# Patient Record
Sex: Female | Born: 1975 | Hispanic: Yes | Smoking: Former smoker
Health system: Southern US, Community
[De-identification: ages and names within clinical notes are randomized; demographics above are authoritative.]

## PROBLEM LIST (undated history)

## (undated) DIAGNOSIS — G35 Multiple sclerosis: Secondary | ICD-10-CM

## (undated) DIAGNOSIS — F329 Major depressive disorder, single episode, unspecified: Secondary | ICD-10-CM

## (undated) DIAGNOSIS — F419 Anxiety disorder, unspecified: Secondary | ICD-10-CM

## (undated) HISTORY — DX: Major depressive disorder, single episode, unspecified: F32.9

## (undated) HISTORY — DX: Anxiety disorder, unspecified: F41.9

## (undated) HISTORY — PX: ANGIOPLASTY: SHX39

---

## 2017-02-20 ENCOUNTER — Inpatient Hospital Stay (HOSPITAL_COMMUNITY)
Admission: EM | Admit: 2017-02-20 | Discharge: 2017-03-03 | DRG: 880 | Disposition: A | Discharge status: Home / Self Care | Payer: Medicare Other | Attending: Family Medicine | Admitting: Family Medicine

## 2017-02-20 ENCOUNTER — Emergency Department (HOSPITAL_COMMUNITY): Payer: Medicare Other

## 2017-02-20 ENCOUNTER — Encounter (HOSPITAL_COMMUNITY): Payer: Self-pay | Admitting: Oncology

## 2017-02-20 DIAGNOSIS — G319 Degenerative disease of nervous system, unspecified: Secondary | ICD-10-CM | POA: Diagnosis present

## 2017-02-20 DIAGNOSIS — F439 Reaction to severe stress, unspecified: Secondary | ICD-10-CM

## 2017-02-20 DIAGNOSIS — E860 Dehydration: Secondary | ICD-10-CM | POA: Diagnosis present

## 2017-02-20 DIAGNOSIS — Z87891 Personal history of nicotine dependence: Secondary | ICD-10-CM

## 2017-02-20 DIAGNOSIS — X58XXXA Exposure to other specified factors, initial encounter: Secondary | ICD-10-CM | POA: Diagnosis present

## 2017-02-20 DIAGNOSIS — R4182 Altered mental status, unspecified: Secondary | ICD-10-CM | POA: Diagnosis not present

## 2017-02-20 DIAGNOSIS — F430 Acute stress reaction: Secondary | ICD-10-CM | POA: Diagnosis present

## 2017-02-20 DIAGNOSIS — Q232 Congenital mitral stenosis: Secondary | ICD-10-CM

## 2017-02-20 DIAGNOSIS — R9389 Abnormal findings on diagnostic imaging of other specified body structures: Secondary | ICD-10-CM

## 2017-02-20 DIAGNOSIS — T7621XA Adult sexual abuse, suspected, initial encounter: Secondary | ICD-10-CM | POA: Diagnosis present

## 2017-02-20 DIAGNOSIS — D72829 Elevated white blood cell count, unspecified: Secondary | ICD-10-CM | POA: Diagnosis present

## 2017-02-20 DIAGNOSIS — F441 Dissociative fugue: Principal | ICD-10-CM | POA: Diagnosis present

## 2017-02-20 DIAGNOSIS — Z880 Allergy status to penicillin: Secondary | ICD-10-CM

## 2017-02-20 DIAGNOSIS — G9340 Encephalopathy, unspecified: Secondary | ICD-10-CM | POA: Diagnosis present

## 2017-02-20 DIAGNOSIS — G35 Multiple sclerosis: Secondary | ICD-10-CM | POA: Diagnosis present

## 2017-02-20 LAB — I-STAT CHEM 8, ED
BUN: 11 mg/dL (ref 6–20)
CHLORIDE: 107 mmol/L (ref 101–111)
CREATININE: 0.6 mg/dL (ref 0.44–1.00)
Calcium, Ion: 1.14 mmol/L — ABNORMAL LOW (ref 1.15–1.40)
GLUCOSE: 76 mg/dL (ref 65–99)
HEMATOCRIT: 38 % (ref 36.0–46.0)
Hemoglobin: 12.9 g/dL (ref 12.0–15.0)
POTASSIUM: 3.5 mmol/L (ref 3.5–5.1)
Sodium: 140 mmol/L (ref 135–145)
TCO2: 25 mmol/L (ref 0–100)

## 2017-02-20 LAB — I-STAT BETA HCG BLOOD, ED (MC, WL, AP ONLY): I-stat hCG, quantitative: <5 m[IU]/mL (ref <5)

## 2017-02-20 LAB — CBG MONITORING, ED: Glucose-Capillary: 86 mg/dL (ref 65–99)

## 2017-02-20 MED ORDER — IOPAMIDOL (ISOVUE-300) INJECTION 61%
INTRAVENOUS | Status: AC
  Administered 2017-02-21: 100 mL via INTRAVENOUS
  Filled 2017-02-20: qty 100
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
## 2017-02-20 NOTE — ED Notes (Signed)
Pt waiting on provider to sign up.
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
## 2017-02-20 NOTE — ED Notes (Signed)
Bed: WA14 Expected date:  Expected time:  Means of arrival:  Comments: AMS 

## 2017-02-21 ENCOUNTER — Observation Stay (HOSPITAL_COMMUNITY): Payer: Medicare Other

## 2017-02-21 ENCOUNTER — Encounter (HOSPITAL_COMMUNITY): Payer: Self-pay | Admitting: Emergency Medicine

## 2017-02-21 ENCOUNTER — Observation Stay (HOSPITAL_BASED_OUTPATIENT_CLINIC_OR_DEPARTMENT_OTHER)
Admit: 2017-02-21 | Discharge: 2017-02-21 | Disposition: A | Discharge status: Home / Self Care | Payer: Medicare Other | Attending: Internal Medicine | Admitting: Internal Medicine

## 2017-02-21 DIAGNOSIS — D72829 Elevated white blood cell count, unspecified: Secondary | ICD-10-CM

## 2017-02-21 DIAGNOSIS — R4182 Altered mental status, unspecified: Secondary | ICD-10-CM

## 2017-02-21 DIAGNOSIS — R17 Unspecified jaundice: Secondary | ICD-10-CM

## 2017-02-21 DIAGNOSIS — R938 Abnormal findings on diagnostic imaging of other specified body structures: Secondary | ICD-10-CM

## 2017-02-21 DIAGNOSIS — G9340 Encephalopathy, unspecified: Secondary | ICD-10-CM | POA: Diagnosis present

## 2017-02-21 LAB — URINALYSIS, COMPLETE (UACMP) WITH MICROSCOPIC
BILIRUBIN URINE: NEGATIVE
Glucose, UA: NEGATIVE mg/dL
KETONES UR: 80 mg/dL — AB
LEUKOCYTES UA: NEGATIVE
NITRITE: NEGATIVE
PH: 6 (ref 5.0–8.0)
Protein, ur: NEGATIVE mg/dL

## 2017-02-21 LAB — RAPID URINE DRUG SCREEN, HOSP PERFORMED
Amphetamines: NOT DETECTED
BARBITURATES: NOT DETECTED
BENZODIAZEPINES: NOT DETECTED
Cocaine: NOT DETECTED
Opiates: NOT DETECTED
Tetrahydrocannabinol: POSITIVE — AB

## 2017-02-21 LAB — CBC WITH DIFFERENTIAL/PLATELET
BASOS ABS: 0 10*3/uL (ref 0.0–0.1)
Basophils Relative: 0 %
EOS PCT: 1 %
Eosinophils Absolute: 0.1 10*3/uL (ref 0.0–0.7)
HCT: 35.7 % — ABNORMAL LOW (ref 36.0–46.0)
Hemoglobin: 12.3 g/dL (ref 12.0–15.0)
LYMPHS PCT: 15 %
Lymphs Abs: 1.8 10*3/uL (ref 0.7–4.0)
MCH: 31.3 pg (ref 26.0–34.0)
MCHC: 34.5 g/dL (ref 30.0–36.0)
MCV: 90.8 fL (ref 78.0–100.0)
Monocytes Absolute: 0.7 10*3/uL (ref 0.1–1.0)
Monocytes Relative: 6 %
NEUTROS ABS: 9.7 10*3/uL — AB (ref 1.7–7.7)
NEUTROS PCT: 78 %
PLATELETS: 379 10*3/uL (ref 150–400)
RBC: 3.93 MIL/uL (ref 3.87–5.11)
RDW: 12.8 % (ref 11.5–15.5)
WBC: 12.3 10*3/uL — AB (ref 4.0–10.5)

## 2017-02-21 LAB — CREATININE, SERUM
Creatinine, Ser: 0.62 mg/dL (ref 0.44–1.00)
GFR calc non Af Amer: 53 mL/min — ABNORMAL LOW (ref >60)

## 2017-02-21 LAB — CBC
HEMATOCRIT: 35 % — AB (ref 36.0–46.0)
Hemoglobin: 12.2 g/dL (ref 12.0–15.0)
MCH: 32.3 pg (ref 26.0–34.0)
MCHC: 34.9 g/dL (ref 30.0–36.0)
MCV: 92.6 fL (ref 78.0–100.0)
PLATELETS: 368 10*3/uL (ref 150–400)
RBC: 3.78 MIL/uL — ABNORMAL LOW (ref 3.87–5.11)
RDW: 13 % (ref 11.5–15.5)
WBC: 10.3 10*3/uL (ref 4.0–10.5)

## 2017-02-21 LAB — T4, FREE: Free T4: 1.24 ng/dL — ABNORMAL HIGH (ref 0.61–1.12)

## 2017-02-21 LAB — VITAMIN B12: Vitamin B-12: 255 pg/mL (ref 180–914)

## 2017-02-21 LAB — HEPATIC FUNCTION PANEL
ALT: 21 U/L (ref 14–54)
AST: 40 U/L (ref 15–41)
Albumin: 3.9 g/dL (ref 3.5–5.0)
Alkaline Phosphatase: 47 U/L (ref 38–126)
BILIRUBIN DIRECT: 0.3 mg/dL (ref 0.1–0.5)
Indirect Bilirubin: 1 mg/dL — ABNORMAL HIGH (ref 0.3–0.9)
Total Bilirubin: 1.3 mg/dL — ABNORMAL HIGH (ref 0.3–1.2)
Total Protein: 6.7 g/dL (ref 6.5–8.1)

## 2017-02-21 LAB — ACETAMINOPHEN LEVEL

## 2017-02-21 LAB — ETHANOL: Alcohol, Ethyl (B): <5 mg/dL (ref <5)

## 2017-02-21 LAB — SALICYLATE LEVEL

## 2017-02-21 LAB — AMMONIA: AMMONIA: 32 umol/L (ref 9–35)

## 2017-02-21 LAB — TSH: TSH: 0.382 u[IU]/mL (ref 0.350–4.500)

## 2017-02-21 MED ORDER — ENOXAPARIN SODIUM 40 MG/0.4ML ~~LOC~~ SOLN
40.0000 mg | SUBCUTANEOUS | Status: DC
  Administered 2017-02-21 – 2017-03-03 (×11): 40 mg via SUBCUTANEOUS
  Filled 2017-02-21 (×11): qty 0.4

## 2017-02-21 MED ORDER — ACETAMINOPHEN 650 MG RE SUPP
650.0000 mg | Freq: Four times a day (QID) | RECTAL | Status: DC | PRN
Start: 2017-02-21 — End: 2017-03-03

## 2017-02-21 MED ORDER — SODIUM CHLORIDE 0.9 % IV SOLN
INTRAVENOUS | Status: DC
  Administered 2017-02-21 – 2017-02-23 (×3): via INTRAVENOUS

## 2017-02-21 MED ORDER — ONDANSETRON HCL 4 MG/2ML IJ SOLN: 4.0000 mg | Freq: Four times a day (QID) | INTRAMUSCULAR | Status: DC | PRN

## 2017-02-21 MED ORDER — ACETAMINOPHEN 325 MG PO TABS: 650.0000 mg | ORAL_TABLET | Freq: Four times a day (QID) | ORAL | Status: DC | PRN

## 2017-02-21 MED ORDER — NALOXONE HCL 2 MG/2ML IJ SOSY
4.0000 mg | PREFILLED_SYRINGE | Freq: Once | INTRAMUSCULAR | Status: AC
  Administered 2017-02-21: 4 mg via INTRAVENOUS
  Filled 2017-02-21: qty 4

## 2017-02-21 MED ORDER — ONDANSETRON HCL 4 MG PO TABS: 4.0000 mg | ORAL_TABLET | Freq: Four times a day (QID) | ORAL | Status: DC | PRN

## 2017-02-21 MED ORDER — SODIUM CHLORIDE 0.9 % IV BOLUS (SEPSIS)
1000.0000 mL | Freq: Once | INTRAVENOUS | Status: AC
  Administered 2017-02-21: 1000 mL via INTRAVENOUS

## 2017-02-21 MED ORDER — GADOBENATE DIMEGLUMINE 529 MG/ML IV SOLN
15.0000 mL | Freq: Once | INTRAVENOUS | Status: AC
  Administered 2017-02-21: 15 mL via INTRAVENOUS

## 2017-02-21 MED ORDER — SENNOSIDES-DOCUSATE SODIUM 8.6-50 MG PO TABS: 1.0000 | ORAL_TABLET | Freq: Every evening | ORAL | Status: DC | PRN

## 2017-02-21 NOTE — Progress Notes (Signed)
CMT called saying patient had all leads off. Upon going to room to reapply tele, the patient was not in the room or BR. Security called and a visitor stated "I saw a patient go down the stairs." Writer ran outside and caught up with the patient in the visitor parking lot. Patient stated, "I wasn't going anywhere I was just going on a walk." Patient escorted back to her room, explained she cannot leave the floor and to call if she wants to walk. Bed alarm set, MD paged. Patient will be IVC'd and sitter at bedside.

## 2017-02-21 NOTE — Progress Notes (Signed)
The patient stated that she remembered her name was "Emily [name]". The patient's belongings are in plastic bags or brown paper bags on the bench-in case [facility] needs them.  Patient cried softly for a short time.
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
## 2017-02-21 NOTE — ED Notes (Addendum)
Poetry from [facility] notified that patient would be admitted and advised there was no change in mentation from earlier. Emily [name] advised that she would pass on information to oncoming day-shift [facility] nurse.

## 2017-02-21 NOTE — Progress Notes (Signed)
EEG Completed; Results Pending  
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
## 2017-02-21 NOTE — Progress Notes (Signed)
CSW is assisting with IVC. Magistrate reports there is a different process when a patient has no identification, waiting for return call from [employer] Physician informed.   Vivi Barrack, Theresia Majors, MSW Clinical Social Worker 5E and Psychiatric Service Line [phone] 02/21/2017  9:49 AM

## 2017-02-22 DIAGNOSIS — D72829 Elevated white blood cell count, unspecified: Secondary | ICD-10-CM | POA: Diagnosis present

## 2017-02-22 DIAGNOSIS — Z880 Allergy status to penicillin: Secondary | ICD-10-CM | POA: Diagnosis not present

## 2017-02-22 DIAGNOSIS — E860 Dehydration: Secondary | ICD-10-CM | POA: Diagnosis present

## 2017-02-22 DIAGNOSIS — F430 Acute stress reaction: Secondary | ICD-10-CM | POA: Diagnosis present

## 2017-02-22 DIAGNOSIS — G35 Multiple sclerosis: Secondary | ICD-10-CM | POA: Diagnosis present

## 2017-02-22 DIAGNOSIS — T7621XA Adult sexual abuse, suspected, initial encounter: Secondary | ICD-10-CM | POA: Diagnosis present

## 2017-02-22 DIAGNOSIS — Z87891 Personal history of nicotine dependence: Secondary | ICD-10-CM

## 2017-02-22 DIAGNOSIS — F441 Dissociative fugue: Secondary | ICD-10-CM | POA: Diagnosis present

## 2017-02-22 DIAGNOSIS — R4182 Altered mental status, unspecified: Secondary | ICD-10-CM | POA: Diagnosis present

## 2017-02-22 DIAGNOSIS — Q232 Congenital mitral stenosis: Secondary | ICD-10-CM | POA: Diagnosis not present

## 2017-02-22 DIAGNOSIS — F439 Reaction to severe stress, unspecified: Secondary | ICD-10-CM

## 2017-02-22 DIAGNOSIS — Z79899 Other long term (current) drug therapy: Secondary | ICD-10-CM

## 2017-02-22 DIAGNOSIS — G9340 Encephalopathy, unspecified: Secondary | ICD-10-CM | POA: Diagnosis present

## 2017-02-22 DIAGNOSIS — F1290 Cannabis use, unspecified, uncomplicated: Secondary | ICD-10-CM

## 2017-02-22 DIAGNOSIS — X58XXXA Exposure to other specified factors, initial encounter: Secondary | ICD-10-CM | POA: Diagnosis present

## 2017-02-22 DIAGNOSIS — G319 Degenerative disease of nervous system, unspecified: Secondary | ICD-10-CM | POA: Diagnosis present

## 2017-02-22 LAB — CBC
HEMATOCRIT: 34.7 % — AB (ref 36.0–46.0)
HEMOGLOBIN: 11.9 g/dL — AB (ref 12.0–15.0)
MCH: 31.6 pg (ref 26.0–34.0)
MCHC: 34.3 g/dL (ref 30.0–36.0)
MCV: 92.3 fL (ref 78.0–100.0)
Platelets: 358 10*3/uL (ref 150–400)
RBC: 3.76 MIL/uL — AB (ref 3.87–5.11)
RDW: 13.2 % (ref 11.5–15.5)
WBC: 7.2 10*3/uL (ref 4.0–10.5)

## 2017-02-22 LAB — COMPREHENSIVE METABOLIC PANEL
ALBUMIN: 3.5 g/dL (ref 3.5–5.0)
ALT: 20 U/L (ref 14–54)
AST: 35 U/L (ref 15–41)
Alkaline Phosphatase: 41 U/L (ref 38–126)
Anion gap: 5 (ref 5–15)
BUN: 5 mg/dL — ABNORMAL LOW (ref 6–20)
CHLORIDE: 109 mmol/L (ref 101–111)
CO2: 26 mmol/L (ref 22–32)
Calcium: 8.6 mg/dL — ABNORMAL LOW (ref 8.9–10.3)
Creatinine, Ser: 0.66 mg/dL (ref 0.44–1.00)
GFR calc non Af Amer: 52 mL/min — ABNORMAL LOW (ref >60)
Glucose, Bld: 112 mg/dL — ABNORMAL HIGH (ref 65–99)
POTASSIUM: 3.5 mmol/L (ref 3.5–5.1)
Sodium: 140 mmol/L (ref 135–145)
Total Bilirubin: 0.5 mg/dL (ref 0.3–1.2)
Total Protein: 5.9 g/dL — ABNORMAL LOW (ref 6.5–8.1)
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
## 2017-02-22 NOTE — Progress Notes (Signed)
Upon assessing patients orientation. Writer asked patient, "do you remember your name or DOB yet?" patient replied, "Yes my name is Emily [name], and my DOB is 11/15/1974"
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
## 2017-02-22 NOTE — Progress Notes (Signed)
CSW stepped in to speak with patient. Patient stated name was "Emily [name]".   Stacy Gardner, LCSWA Clinical Social Worker [phone]

## 2017-02-23 DIAGNOSIS — R4182 Altered mental status, unspecified: Secondary | ICD-10-CM

## 2017-02-23 LAB — SEDIMENTATION RATE: Sed Rate: 10 mm/hr (ref 0–22)

## 2017-02-23 LAB — BASIC METABOLIC PANEL
ANION GAP: 5 (ref 5–15)
CHLORIDE: 111 mmol/L (ref 101–111)
CO2: 24 mmol/L (ref 22–32)
Calcium: 8.6 mg/dL — ABNORMAL LOW (ref 8.9–10.3)
Creatinine, Ser: 0.51 mg/dL (ref 0.44–1.00)
GFR calc Af Amer: >60 mL/min (ref >60)
GFR calc non Af Amer: 56 mL/min — ABNORMAL LOW (ref >60)
GLUCOSE: 93 mg/dL (ref 65–99)
POTASSIUM: 3.6 mmol/L (ref 3.5–5.1)
Sodium: 140 mmol/L (ref 135–145)

## 2017-02-23 LAB — HEPATIC FUNCTION PANEL
ALBUMIN: 3.6 g/dL (ref 3.5–5.0)
ALK PHOS: 39 U/L (ref 38–126)
ALT: 19 U/L (ref 14–54)
AST: 28 U/L (ref 15–41)
BILIRUBIN TOTAL: 0.7 mg/dL (ref 0.3–1.2)
Bilirubin, Direct: <0.1 mg/dL — ABNORMAL LOW (ref 0.1–0.5)
Total Protein: 6 g/dL — ABNORMAL LOW (ref 6.5–8.1)

## 2017-02-23 LAB — CBC WITH DIFFERENTIAL/PLATELET
BASOS PCT: 1 %
Basophils Absolute: 0.1 10*3/uL (ref 0.0–0.1)
Eosinophils Absolute: 1.4 10*3/uL — ABNORMAL HIGH (ref 0.0–0.7)
Eosinophils Relative: 20 %
HEMATOCRIT: 34.6 % — AB (ref 36.0–46.0)
HEMOGLOBIN: 11.9 g/dL — AB (ref 12.0–15.0)
LYMPHS ABS: 1.6 10*3/uL (ref 0.7–4.0)
Lymphocytes Relative: 21 %
MCH: 31.5 pg (ref 26.0–34.0)
MCHC: 34.4 g/dL (ref 30.0–36.0)
MCV: 91.5 fL (ref 78.0–100.0)
MONO ABS: 0.6 10*3/uL (ref 0.1–1.0)
MONOS PCT: 9 %
NEUTROS ABS: 3.6 10*3/uL (ref 1.7–7.7)
NEUTROS PCT: 49 %
Platelets: 330 10*3/uL (ref 150–400)
RBC: 3.78 MIL/uL — ABNORMAL LOW (ref 3.87–5.11)
RDW: 13.1 % (ref 11.5–15.5)
WBC: 7.3 10*3/uL (ref 4.0–10.5)

## 2017-02-23 LAB — MAGNESIUM: Magnesium: 1.8 mg/dL (ref 1.7–2.4)

## 2017-02-23 LAB — C-REACTIVE PROTEIN: CRP: <0.8 mg/dL (ref <1.0)

## 2017-02-23 NOTE — Progress Notes (Signed)
Assuming care of patient from previous RN.  Agree with previous RN's assessment of patient. Patient stable at this time. Will continue to monitor patient.  

## 2017-02-23 NOTE — Care Management Note (Signed)
Case Management Note  Patient Details  Name: Emily [name] MRN: [id number] Date of Birth: 11/15/1874  Subjective/Objective:  CSW-managing d/c to [facility].                  Action/Plan:d/c [facility]   Expected Discharge Date:  02/23/17               Expected Discharge Plan:  Psychiatric Hospital  In-House Referral:  Clinical Social Work  Discharge planning Services  CM Consult  Post Acute Care Choice:    Choice offered to:     DME Arranged:    DME Agency:     HH Arranged:    HH Agency:     Status of Service:  Completed, signed off  If discussed at [employer], dates discussed:    Additional Comments:  Lanier Clam, RN 02/23/2017, 11:49 AM

## 2017-02-23 NOTE — Progress Notes (Signed)
CSW's [place] and [place]) spoke with patient at bedside, patient continues to report that she doesn't know who she is or where she is from. Patient reports that she remembered this morning that she was a muslim and does not know how Keveon Amsler she has been a muslim. CSW informed patient about psychiatrist recommendation for psychiatric inpatient admission. CSW asked patient if she had been to an inpatient psychiatric hospital, patient replied "no, not that I know of". CSW inquired if patient had any questions, patient replied no.   CSW contacted  and [facility] to inquire about placement for patient, CSW awaiting return calls from both facilities. CSW will continue to follow for discharge planning.   Celso Sickle, [place] Clinical Social Worker [facility] Cell#: [phone]
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
## 2017-02-24 NOTE — Progress Notes (Signed)
CSW discussed placement with Inetta Fermo at [facility]. The facility does not have beds a this time, will continue to keep CSW updated.

## 2017-02-25 LAB — HIV ANTIBODY (ROUTINE TESTING W REFLEX): HIV Screen 4th Generation wRfx: NONREACTIVE

## 2017-02-25 NOTE — Progress Notes (Signed)
Patient asked if she wanted family to know where she was and she states no. Explained to patient what it meant to be a triple X and she verbalized that she wanted to be a triple X patient. Will continue to monitor patient.

## 2017-02-25 NOTE — Plan of Care (Signed)
Problem: Nutrition: Goal: Adequate nutrition will be maintained Outcome: Completed/Met Date Met: 02/25/17 Pt tolerates po's well
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
## 2017-02-27 DIAGNOSIS — R4182 Altered mental status, unspecified: Secondary | ICD-10-CM

## 2017-02-27 DIAGNOSIS — R9389 Abnormal findings on diagnostic imaging of other specified body structures: Secondary | ICD-10-CM
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
## 2017-02-27 NOTE — Care Management Note (Signed)
Case Management Note  Patient Details  Name: Emily [name] MRN: [id number] Date of Birth: 1975-11-18  Subjective/Objective:   Acute encephalopathy, dissociative fugue d/t to stress reaction               Action/Plan: Discharge Planning: Contacted CSW to follow up on placement.    Expected Discharge Date:  02/25/17               Expected Discharge Plan:  Psychiatric Hospital  In-House Referral:  Clinical Social Work  Discharge planning Services  CM Consult  Post Acute Care Choice:  NA Choice offered to:  NA  DME Arranged:  N/A DME Agency:  NA  HH Arranged:  NA HH Agency:  NA  Status of Service:  Completed, signed off  If discussed at [employer] of Stay Meetings, dates discussed:    Additional Comments:  Elliot Cousin, RN 02/27/2017, 10:49 AM

## 2017-02-28 LAB — CREATININE, SERUM: Creatinine, Ser: 0.71 mg/dL (ref 0.44–1.00)

## 2017-02-28 NOTE — Progress Notes (Signed)
Taking over care of patient. Denies any needs at this time, sitter at bedside. Will continue to monitor.

## 2017-02-28 NOTE — Progress Notes (Signed)
Patient is quiet this am.Pt refers to go by Emily [name]. Sitter remains at bedside. No needs at this time
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
## 2017-03-01 NOTE — Progress Notes (Signed)
Ok to leave IV out per Dr Marland Mcalpine
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
## 2017-03-01 NOTE — Progress Notes (Signed)
Dressed IV site in rt [facility] x3 today w/ TABT oint and bandaid per pt request  site looks healed

## 2017-03-02 NOTE — Progress Notes (Signed)
Psychiatrist is still recommending inpatient. Patient still on [facility], awaiting placement.   Emily [name], Theresia Majors, MSW Clinical Social Worker 5E and Psychiatric Service Line [phone] 03/02/2017  10:54 AM
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
## 2017-03-03 DIAGNOSIS — R412 Retrograde amnesia: Secondary | ICD-10-CM
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
## 2017-03-03 NOTE — Progress Notes (Signed)
D/C instructions reviewed w/ pt. Pt verbalizes understanding and all questions answered. [employer] called and arranged to meet pt at [address] so pt can get her ID and personal belonging from that address. [employer] called and arranged for transport to above address w/ taxi voucher provided by SW. Pt aware of all arrangements and in agreement.
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
## 2017-03-03 NOTE — Progress Notes (Signed)
Patient ID: Emily [name], female   DOB: July 09, 1976, 41 y.o.   MRN: [id number]   Case discussed with LCSW, chart reviewed and based on the positive changes in patient mental status since admission with acute encephalopathy vs psychogenic fugue due acute stress reaction and most of the memory seems to be back, she has no memory loss and no cognitive deficits as per MMSE. She has no danger to herself and others. She has intact ADL's. She does not meet criteria for acute psych admission any longer. She will be psychiatrically cleared for out patient psych referrals only.  [facility] 03/03/2017 12:37 PM
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
## 2017-03-03 NOTE — Care Management Note (Signed)
Case Management Note  Patient Details  Name: LIVIANNA MAYNOR MRN: [id number] Date of Birth: May 17, 1976  Subjective/Objective: Case discussed in [facility]-Medical director & Psych Dr will discuss case.CSW diligently following.                   Action/Plan:current d/c plan IP Psych.   Expected Discharge Date:                 Expected Discharge Plan:  Psychiatric Hospital  In-House Referral:  Clinical Social Work  Discharge planning Services  CM Consult  Post Acute Care Choice:  NA Choice offered to:  NA  DME Arranged:  N/A DME Agency:  NA  HH Arranged:  NA HH Agency:  NA  Status of Service:  Completed, signed off  If discussed at Long Length of Stay Meetings, dates discussed:    Additional Comments:  Lanier Clam, RN 03/03/2017, 11:11 AM
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
## 2017-03-09 ENCOUNTER — Encounter (HOSPITAL_COMMUNITY): Payer: Self-pay | Admitting: Emergency Medicine

## 2017-03-09 ENCOUNTER — Emergency Department (HOSPITAL_COMMUNITY)
Admission: EM | Admit: 2017-03-09 | Discharge: 2017-03-10 | Disposition: A | Discharge status: Home / Self Care | Payer: Medicare Other | Attending: Physician Assistant | Admitting: Physician Assistant

## 2017-03-09 DIAGNOSIS — Z87891 Personal history of nicotine dependence: Secondary | ICD-10-CM | POA: Insufficient documentation

## 2017-03-09 DIAGNOSIS — M7989 Other specified soft tissue disorders: Secondary | ICD-10-CM | POA: Diagnosis present

## 2017-03-09 DIAGNOSIS — R600 Localized edema: Secondary | ICD-10-CM | POA: Insufficient documentation

## 2017-03-09 DIAGNOSIS — R609 Edema, unspecified: Secondary | ICD-10-CM

## 2017-03-09 HISTORY — DX: Multiple sclerosis: G35

## 2017-03-09 NOTE — ED Notes (Signed)
Bed: ZO10 Expected date:  Expected time:  Means of arrival:  Comments: EMS 41 yo female swelling in both ankles x 2 days-pain left leg

## 2017-03-09 NOTE — ED Provider Notes (Signed)
[facility]-EMERGENCY DEPT Provider Note   CSN: [id number] Arrival date & time: 03/09/17  1955     History   Chief Complaint Chief Complaint  Patient presents with  . Leg Swelling    Bilateral Ankles    HPI Emily [name] is a 41 y.o. female.  HPI   Patient is a 41 year old feel distended bilateral leg swelling. She reports it gets worse when she walks. Patient is walking a lot because she is homeless. Patient just wanted to make sure everything was okay.  Patient has good range of motion denies any   Past Medical History:  Diagnosis Date  . MS (multiple sclerosis) [facility])     Patient Active Problem List   Diagnosis Date Noted  . Abnormal MRI   . Altered mental status   . 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 Acute encephalopathy 02/21/2017    Past Surgical History:  Procedure Laterality Date  . ANGIOPLASTY      OB History    No data available       Home Medications    Prior to Admission medications   Not on File    Family History No family history on file.  Social History Social History  Substance Use Topics  . Smoking status: Former Games developer  . Smokeless tobacco: Never Used  . Alcohol use Yes     Allergies   Penicillins   Review of Systems Review of Systems  Constitutional: Negative for activity change.  Respiratory: Negative for shortness of breath.   Cardiovascular: Negative for chest pain.  Gastrointestinal: Negative for abdominal pain.     Physical Exam Updated Vital Signs BP (!) 126/97 (BP Location: Left Arm)   Pulse 85   Temp 98.1 F (36.7 C) (Oral)   Resp 19   Ht 5\' 8"  (1.727 m)   Wt 165 lb (74.8 kg)   LMP 02/15/2017 (Approximate) Comment: negative beta HCG 02/21/17  SpO2 100%   BMI 25.09 kg/m   Physical Exam  Constitutional: She is oriented to person, place, and time. She appears well-developed and well-nourished.  HENT:  Head: Normocephalic and atraumatic.  Eyes: Right eye exhibits no discharge.    Cardiovascular: Normal rate.   Pulmonary/Chest: Effort normal.  Musculoskeletal:  No edema  Neurological: She is oriented to person, place, and time.  Skin: Skin is warm and dry. She is not diaphoretic.  Psychiatric: She has a normal mood and affect.  Nursing note and vitals reviewed.    ED Treatments / Results  Labs (all labs ordered are listed, but only abnormal results are displayed) Labs Reviewed - No data to display  EKG  EKG Interpretation None       Radiology No results found.  Procedures Procedures (including critical care time)  Medications Ordered in ED Medications - No data to display   Initial Impression / Assessment and Plan / ED Course  I have reviewed the triage vital signs and the nursing notes.  Pertinent labs & imaging results that were available during my care of the patient were reviewed by me and considered in my medical decision making (see chart for details).    Patient is a 41 year old female presenting with leg swelling. I do not see any leg swelling on exam. This could have been from stasis from walking. We'll have her follow up with primary care physician. No signs of infection, no edema noted.    Final Clinical Impressions(s) / ED Diagnoses   Final diagnoses:  None  New Prescriptions New Prescriptions   No medications on file     Shalea Tomczak Randall An, MD 03/10/17 [phone]

## 2017-03-09 NOTE — Congregational Nurse Program (Signed)
Congregational Nurse Program Note  Date of Encounter: 03/08/2017  Past Medical History: Past Medical History:  Diagnosis Date  . MS (congenital mitral stenosis)     Encounter Details:     CNP Questionnaire - 03/08/17 1508      Patient Demographics   Is this a new or existing patient? Existing   Patient is considered a/an Not Applicable   Race Bi-Racial/Multi-Racial     Patient Assistance   Location of Patient Assistance Not Applicable   Patient's financial/insurance status Low Income;Medicaid   Uninsured Patient ([employer], trade union) No   Patient referred to apply for the following financial assistance Not Applicable   Food insecurities addressed Not Applicable   Transportation assistance No   Assistance securing medications No   Educational health offerings Behavioral health;Navigating the healthcare system;Safety     Encounter Details   Primary purpose of visit Safety;Navigating the Healthcare System   Was an Emergency Department visit averted? Not Applicable   Does patient have a medical provider? No   Patient referred to Other   Was a mental health screening completed? (GAINS tool) No   Does patient have dental issues? No   Does patient have vision issues? No   Does your patient have an abnormal blood pressure today? No   Since previous encounter, have you referred patient for abnormal blood pressure that resulted in a new diagnosis or medication change? No   Does your patient have an abnormal blood glucose today? No   Since previous encounter, have you referred patient for abnormal blood glucose that resulted in a new diagnosis or medication change? No   Was there a life-saving intervention made? No     Client came to the [facility] as scheduled.  Shared with client resources for counseling through the HOPE program with [facility] as well as the [employer].  She states she expects to receive her disability check very soon and will begin to look for housing.   Client had to leave before I could set an appointment with Internal Medicine.  Encouraged her to RTC.
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
## 2017-03-10 NOTE — Discharge Instructions (Signed)
To find a primary care or specialty doctor please call [phone] or [phone] to access "[facility] Find a Doctor Service." ° °You may also go on the [facility] website at [email or web address] ° °There are also multiple [facility],  and [facility] practices throughout the [place] that are frequently accepting new patients. You may find a clinic that is close to your home and contact them. ° ° and Wellness -  °[address] °[place] [address] °[phone] ° °[facility] in [place] (also locations in [place] and [place]) -  °[address] °[place] [address] [phone] ° °[facility] -  °[address] °[place] [address] °[phone] ° ° °

## 2017-04-01 ENCOUNTER — Encounter (HOSPITAL_COMMUNITY): Payer: Self-pay | Admitting: *Deleted

## 2017-04-01 ENCOUNTER — Emergency Department (HOSPITAL_COMMUNITY)
Admission: EM | Admit: 2017-04-01 | Discharge: 2017-04-01 | Disposition: A | Discharge status: Home / Self Care | Payer: Medicare Other | Attending: Emergency Medicine | Admitting: Emergency Medicine

## 2017-04-01 DIAGNOSIS — Z79899 Other long term (current) drug therapy: Secondary | ICD-10-CM | POA: Insufficient documentation

## 2017-04-01 DIAGNOSIS — Y9301 Activity, walking, marching and hiking: Secondary | ICD-10-CM | POA: Insufficient documentation

## 2017-04-01 DIAGNOSIS — S99921A Unspecified injury of right foot, initial encounter: Secondary | ICD-10-CM | POA: Diagnosis present

## 2017-04-01 DIAGNOSIS — Y999 Unspecified external cause status: Secondary | ICD-10-CM | POA: Insufficient documentation

## 2017-04-01 DIAGNOSIS — Z87891 Personal history of nicotine dependence: Secondary | ICD-10-CM | POA: Diagnosis not present

## 2017-04-01 DIAGNOSIS — X58XXXA Exposure to other specified factors, initial encounter: Secondary | ICD-10-CM | POA: Insufficient documentation

## 2017-04-01 DIAGNOSIS — Y929 Unspecified place or not applicable: Secondary | ICD-10-CM | POA: Insufficient documentation

## 2017-04-01 DIAGNOSIS — S90822A Blister (nonthermal), left foot, initial encounter: Secondary | ICD-10-CM | POA: Insufficient documentation

## 2017-04-01 DIAGNOSIS — S90821A Blister (nonthermal), right foot, initial encounter: Secondary | ICD-10-CM

## 2017-04-01 MED ORDER — IBUPROFEN 600 MG PO TABS: 600.0000 mg | ORAL_TABLET | Freq: Four times a day (QID) | ORAL | 0 refills | Status: DC | PRN

## 2017-04-01 MED ORDER — IBUPROFEN 400 MG PO TABS
600.0000 mg | ORAL_TABLET | Freq: Once | ORAL | Status: AC
  Administered 2017-04-01: 600 mg via ORAL
  Filled 2017-04-01: qty 1

## 2017-04-01 MED ORDER — ACETAMINOPHEN 500 MG PO TABS: 500.0000 mg | ORAL_TABLET | Freq: Four times a day (QID) | ORAL | 0 refills | Status: DC | PRN

## 2017-04-01 NOTE — Discharge Instructions (Signed)
Continue to apply antibiotic ointment to the affected areas and cover with gauze or Band-Aid. You may take ibuprofen or Tylenol for pain. You may also apply warm compresses or ice to the affected areas. Follow up with a primary care if symptoms persist. Return to the ED if any concerning symptoms develop.

## 2017-04-01 NOTE — ED Triage Notes (Signed)
Pt states she has bilateral lower leg pain and has painful blisters to feet.  Not diabetic.  Pt has had the blisters for 2 days and states it is because she has been looking and walking in the city for a job wearing sandals.

## 2017-04-01 NOTE — ED Provider Notes (Addendum)
[facility]-EMERGENCY DEPT Provider Note   CSN: [id number] Arrival date & time: 04/01/17  1806  By signing my name below, I, Cynda Acres, attest that this documentation has been prepared under the direction and in the presence of [facility], PA-C. Electronically Signed: Cynda Acres, Scribe. 04/01/17. 7:04 PM.  History   Chief Complaint Chief Complaint  Patient presents with  . Foot Pain  . Leg Pain    HPI Comments: Emily [name] is a 41 y.o. female with no pertinent past medical history, who presents to the Emergency Department complaining of sudden-onset, constant bilateral foot pain that began 2 days ago. Patient reports going on long walks in sandals for the past two days, which caused blisters to form on the undersides of both feet. Patient reports associated pain radiating up the bilateral knees, bilateral leg swelling, and numbness/weakness on ambulation. She states she has had symptoms like this in the past with her "shin splints". Patient reports applying an antibiotic ointment with no relief. Patient describes her pain as sharp and severe on palpation. Patient is ambulatory in the emergency department. Patient denies any fever, chills, nausea, vomiting, shortness of breath, or chest pain.  The history is provided by the patient.    Past Medical History:  Diagnosis Date  . MS (multiple sclerosis) [facility])     Patient Active Problem List   Diagnosis Date Noted  . Abnormal MRI   . Altered mental status   . 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 Acute encephalopathy 02/21/2017    Past Surgical History:  Procedure Laterality Date  . ANGIOPLASTY      OB History    No data available       Home Medications    Prior to Admission medications   Medication Sig Start Date End Date Taking? Authorizing Provider  acetaminophen (TYLENOL) 500 MG tablet Take 1 tablet (500 mg total) by mouth every 6 (six) hours as needed. 04/01/17   Jordany Russett A, PA-C  ibuprofen  (ADVIL,MOTRIN) 600 MG tablet Take 1 tablet (600 mg total) by mouth every 6 (six) hours as needed. 04/01/17   Jeanie Sewer, PA-C    Family History No family history on file.  Social History Social History  Substance Use Topics  . Smoking status: Former Games developer  . Smokeless tobacco: Never Used  . Alcohol use Yes     Allergies   Penicillins   Review of Systems Review of Systems  Constitutional: Negative for chills and fever.  Cardiovascular: Positive for leg swelling.  Gastrointestinal: Negative for abdominal pain.  Musculoskeletal: Negative for back pain.  Skin: Positive for wound.  Neurological: Negative for weakness and numbness.  All other systems reviewed and are negative.    Physical Exam Updated Vital Signs BP 107/64   Pulse 94   Temp 98.6 F (37 C) (Oral)   Resp 18   LMP 03/16/2017   SpO2 100%   Physical Exam  Constitutional: She appears well-developed and well-nourished.  HENT:  Head: Normocephalic and atraumatic.  Eyes: Conjunctivae are normal. Right eye exhibits no discharge. Left eye exhibits no discharge.  Neck: No JVD present. No tracheal deviation present.  Cardiovascular: Normal rate.   2+ DP/PT pulses bl, negative Homan's bl, no significant swelling of BLE and non-pitting   Pulmonary/Chest: Effort normal.  Abdominal: She exhibits no distension.  Musculoskeletal: Normal range of motion. She exhibits no tenderness.  5/5 strength BLE, full ROM of bl ankles  Neurological: She is alert.  Fluent speech,  no facial droop, sensation intact globally, ambulates easily while in ED.   Skin: Skin is warm and dry. Capillary refill takes less than 2 seconds.  2cm x 1cm blister to underside of bl feet between the 3rd and 4th toes. Left blister de-roofed with serous drainage but no bleeding, erythema or fluctuance noted to either blister. There is a small scab to the posterior heel of the right foot, no bleeding or erythema or tenderness noted.   Psychiatric: She  has a normal mood and affect. Her behavior is normal.     ED Treatments / Results  DIAGNOSTIC STUDIES: Oxygen Saturation is 100% on RA, normal by my interpretation.    COORDINATION OF CARE: 7:03 PM Discussed treatment plan with pt at bedside and pt agreed to plan, which includes tylenol and ibuprofen.   Labs (all labs ordered are listed, but only abnormal results are displayed) Labs Reviewed - No data to display  EKG  EKG Interpretation None       Radiology No results found.  Procedures Procedures (including critical care time)  Medications Ordered in ED Medications  ibuprofen (ADVIL,MOTRIN) tablet 600 mg (600 mg Oral Given 04/01/17 1905)     Initial Impression / Assessment and Plan / ED Course  I have reviewed the triage vital signs and the nursing notes.  Pertinent labs & imaging results that were available during my care of the patient were reviewed by me and considered in my medical decision making (see chart for details).     Patient with blisters to the undersides of both feet after long walks. Afebrile, vital signs are stable. No evidence of DVT, cellulitis, compartment syndrome. Minimal swelling of bilateral lower extremities, low suspicion of acute onset CHF. Discussed proper wound care of blisters with antibiotic ointment and removal of aggravating factors. Discussed follow-up with primary care for reevaluation and further wound management. She will also use compression stockings, elevate the extremities, and avoid high salt diet which she has been eating while in the homeless shelter to reduce swelling. Discussed strict ED return precautions. Pt verbalized understanding of and agreement with plan and is safe for discharge home at this time.  Final Clinical Impressions(s) / ED Diagnoses   Final diagnoses:  Blister of right foot, initial encounter  Blister of left foot, initial encounter    New Prescriptions Discharge Medication List as of 04/01/2017  7:05 PM     START taking these medications   Details  acetaminophen (TYLENOL) 500 MG tablet Take 1 tablet (500 mg total) by mouth every 6 (six) hours as needed., Starting Fri 04/01/2017, Print    ibuprofen (ADVIL,MOTRIN) 600 MG tablet Take 1 tablet (600 mg total) by mouth every 6 (six) hours as needed., Starting Fri 04/01/2017, Print       I personally performed the services described in this documentation, which was scribed in my presence. The recorded information has been reviewed and is accurate.     Bennye Alm 04/01/17 2327    Maia Plan, MD 04/01/17 2337    Jeanie Sewer, PA-C 04/02/17 1610    Maia Plan, MD 04/02/17 [phone]

## 2017-09-20 ENCOUNTER — Emergency Department (HOSPITAL_COMMUNITY)
Admission: EM | Admit: 2017-09-20 | Discharge: 2017-09-20 | Disposition: A | Discharge status: Home / Self Care | Payer: Medicare Other | Attending: Emergency Medicine | Admitting: Emergency Medicine

## 2017-09-20 ENCOUNTER — Other Ambulatory Visit: Payer: Self-pay

## 2017-09-20 ENCOUNTER — Encounter (HOSPITAL_COMMUNITY): Payer: Self-pay

## 2017-09-20 DIAGNOSIS — F1290 Cannabis use, unspecified, uncomplicated: Secondary | ICD-10-CM | POA: Insufficient documentation

## 2017-09-20 DIAGNOSIS — R3915 Urgency of urination: Secondary | ICD-10-CM | POA: Diagnosis not present

## 2017-09-20 DIAGNOSIS — R102 Pelvic and perineal pain: Secondary | ICD-10-CM | POA: Diagnosis not present

## 2017-09-20 DIAGNOSIS — G35 Multiple sclerosis: Secondary | ICD-10-CM | POA: Diagnosis not present

## 2017-09-20 DIAGNOSIS — Z87891 Personal history of nicotine dependence: Secondary | ICD-10-CM | POA: Diagnosis not present

## 2017-09-20 DIAGNOSIS — R1032 Left lower quadrant pain: Secondary | ICD-10-CM | POA: Diagnosis present

## 2017-09-20 LAB — COMPREHENSIVE METABOLIC PANEL
ALBUMIN: 3.9 g/dL (ref 3.5–5.0)
ALK PHOS: 42 U/L (ref 38–126)
ALT: 13 U/L — AB (ref 14–54)
ANION GAP: 7 (ref 5–15)
AST: 19 U/L (ref 15–41)
BUN: 9 mg/dL (ref 6–20)
CHLORIDE: 109 mmol/L (ref 101–111)
CO2: 22 mmol/L (ref 22–32)
CREATININE: 0.82 mg/dL (ref 0.44–1.00)
Calcium: 8.9 mg/dL (ref 8.9–10.3)
Glucose, Bld: 77 mg/dL (ref 65–99)
Potassium: 3.8 mmol/L (ref 3.5–5.1)
SODIUM: 138 mmol/L (ref 135–145)
Total Bilirubin: 0.3 mg/dL (ref 0.3–1.2)
Total Protein: 6.3 g/dL — ABNORMAL LOW (ref 6.5–8.1)

## 2017-09-20 LAB — URINALYSIS, ROUTINE W REFLEX MICROSCOPIC
Bilirubin Urine: NEGATIVE
GLUCOSE, UA: NEGATIVE mg/dL
HGB URINE DIPSTICK: NEGATIVE
Ketones, ur: NEGATIVE mg/dL
Leukocytes, UA: NEGATIVE
Nitrite: NEGATIVE
PROTEIN: NEGATIVE mg/dL
SPECIFIC GRAVITY, URINE: 1.018 (ref 1.005–1.030)
pH: 6 (ref 5.0–8.0)

## 2017-09-20 LAB — LIPASE, BLOOD: Lipase: 25 U/L (ref 11–51)

## 2017-09-20 LAB — CBC
HCT: 38.3 % (ref 36.0–46.0)
Hemoglobin: 13.5 g/dL (ref 12.0–15.0)
MCH: 34 pg (ref 26.0–34.0)
MCHC: 35.2 g/dL (ref 30.0–36.0)
MCV: 96.5 fL (ref 78.0–100.0)
PLATELETS: 323 10*3/uL (ref 150–400)
RBC: 3.97 MIL/uL (ref 3.87–5.11)
RDW: 12.9 % (ref 11.5–15.5)
WBC: 10.1 10*3/uL (ref 4.0–10.5)

## 2017-09-20 LAB — WET PREP, GENITAL
CLUE CELLS WET PREP: NONE SEEN
SPERM: NONE SEEN
TRICH WET PREP: NONE SEEN
YEAST WET PREP: NONE SEEN

## 2017-09-20 LAB — HCG, QUANTITATIVE, PREGNANCY: HCG, BETA CHAIN, QUANT, S: 1 m[IU]/mL (ref <5)

## 2017-09-20 LAB — PREGNANCY, URINE: Preg Test, Ur: NEGATIVE

## 2017-09-20 NOTE — Discharge Instructions (Signed)
Schedule appointment with neurology for further assessment of your MS and urinary urgency. Return to the emergency room if you develop fevers, persistent pain, vomiting, or any new or worsening symptoms.

## 2017-09-20 NOTE — ED Provider Notes (Signed)
Planes of urinary incontinence for 6 months, unchanged today.  She presented today as "I am tired of it" she also complains of intermittent left lower quadrant pain for 6 months, no pain at present.  No treatment prior to coming here.  Other associated symptoms include unsteady gait for several years.  She has multiple sclerosis which has gone untreated and not evaluated for several months since moved from [place].  On exam alert no distress lungs clear to auscultation heart regular rate and rhythm abdomen soft nontender neurologic gait slightly unsteady.  However walks without assistance.  DTRs symmetric bilaterally at knee jerk ankle jerk and biceps toes downgoing bilaterally   Doug SouJacubowitz, Dequann Vandervelden, MD 09/20/17 [phone]

## 2017-09-20 NOTE — ED Triage Notes (Addendum)
Pt reports LLQ pain with urinary incontinence. Pain has been ongoing x 4-5 months, however incontinence began ~ 1 month ago. Pt denies n/v.   Pt has hx of MS

## 2017-09-20 NOTE — ED Provider Notes (Signed)
[facility] EMERGENCY DEPARTMENT Provider Note   CSN: [id number] Arrival date & time: 09/20/17  1417     History   Chief Complaint Chief Complaint  Patient presents with  . Abdominal Pain  . Urinary Incontinence    HPI Nathaniel ManMelissa J Vanvleck is a 41 y.o. female presenting with urinary urgency.   Patient states that for the past 4-5 months, she has had left lower quadrant abdominal pain during urination.  She reports 1 month history of worsening urinary urgency and incontinence.  Additionally, she states occasionally her urine smells very foul.  She denies fevers, chills, cough, chest pain, shortness of breath, nausea, vomiting, abdominal pain elsewhere, or abnormal bowel movements.  The pain is only during urination, described as a pulling feeling.  It is anterior, no pain posterior.  She denies hematuria or dysuria.  She denies history of similar.  She has not been evaluated for this.  She has a history of MS, but she is not taking any medications for this.  She was seen by neurologist several years ago in [place], but does not follow with a neurologist in [place].  She does not know when her last period was, there is a chance that she could be pregnant.  She is not using any birth control.  She denies vaginal discharge. She has one female sexual partner. She does not want to be tested for HIV or syphilis today.   HPI  Past Medical History:  Diagnosis Date  . MS (multiple sclerosis) [facility](HCC)     Patient Active Problem List   Diagnosis Date Noted  . Abnormal MRI   . Altered mental status   . 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 Acute encephalopathy 02/21/2017    Past Surgical History:  Procedure Laterality Date  . ANGIOPLASTY      OB History    No data available       Home Medications    Prior to Admission medications   Medication Sig Start Date End Date Taking? Authorizing Provider  acetaminophen (TYLENOL) 500 MG tablet  Take 1 tablet (500 mg total) by mouth every 6 (six) hours as needed. Patient not taking: Reported on 09/20/2017 04/01/17   Michela PitcherFawze, Mina A, PA-C  ibuprofen (ADVIL,MOTRIN) 600 MG tablet Take 1 tablet (600 mg total) by mouth every 6 (six) hours as needed. Patient not taking: Reported on 09/20/2017 04/01/17   Jeanie SewerFawze, Mina A, PA-C    Family History No family history on file.  Social History Social History   Tobacco Use  . Smoking status: Former Games developermoker  . Smokeless tobacco: Never Used  Substance Use Topics  . Alcohol use: Yes  . Drug use: Yes    Types: Marijuana     Allergies   Penicillins   Review of Systems Review of Systems  Constitutional: Negative for chills and fever.  HENT: Negative for congestion and sore throat.   Respiratory: Negative for cough, chest tightness and shortness of breath.   Cardiovascular: Negative for chest pain.  Gastrointestinal: Negative for constipation, diarrhea, nausea and vomiting.  Genitourinary: Positive for pelvic pain and urgency. Negative for dysuria, flank pain, hematuria, vaginal bleeding, vaginal discharge and vaginal pain.  Musculoskeletal: Negative for back pain.  Skin: Negative for wound.  Allergic/Immunologic: Negative for immunocompromised state.  Neurological: Negative for weakness and headaches.  Hematological: Does not bruise/bleed easily.     Physical Exam Updated Vital Signs BP 123/71 (BP Location: Right Arm)   Pulse 79  Temp 98.1 F (36.7 C) (Oral)   Resp 18   SpO2 99%   Physical Exam  Constitutional: She is oriented to person, place, and time. She appears well-developed and well-nourished. No distress.  HENT:  Head: Normocephalic and atraumatic.  Eyes: EOM are normal.  Neck: Normal range of motion.  Cardiovascular: Normal rate, regular rhythm and intact distal pulses.  Pulmonary/Chest: Effort normal and breath sounds normal. No respiratory distress. She has no wheezes. She has no rales. She exhibits no tenderness.    Abdominal: Soft. She exhibits no distension and no mass. There is no tenderness. There is no rigidity, no rebound, no guarding, no CVA tenderness and no tenderness at McBurney's point. Hernia confirmed negative in the right inguinal area and confirmed negative in the left inguinal area.  Genitourinary: Rectum normal, vagina normal and uterus normal. Pelvic exam was performed with patient supine. There is no rash, tenderness or lesion on the right labia. There is no rash, tenderness or lesion on the left labia. Cervix exhibits discharge. Cervix exhibits no motion tenderness and no friability. Right adnexum displays no mass, no tenderness and no fullness. Left adnexum displays no mass, no tenderness and no fullness.  Genitourinary Comments: Chaperone present.  Thick white discharge noted on pelvic exam coming from the cervix.  No cervical motion tenderness or pain during exam.  Left sided pelvic pain is not reproducible during exam.  No obvious masses or other abnormality  Musculoskeletal: Normal range of motion.  Lymphadenopathy: No inguinal adenopathy noted on the right or left side.  Neurological: She is alert and oriented to person, place, and time.  Skin: Skin is warm and dry.  Psychiatric: She has a normal mood and affect.  Nursing note and vitals reviewed.    ED Treatments / Results  Labs (all labs ordered are listed, but only abnormal results are displayed) Labs Reviewed  WET PREP, GENITAL - Abnormal; Notable for the following components:      Result Value   WBC, Wet Prep HPF POC FEW (*)    All other components within normal limits  COMPREHENSIVE METABOLIC PANEL - Abnormal; Notable for the following components:   Total Protein 6.3 (*)    ALT 13 (*)    All other components within normal limits  URINALYSIS, ROUTINE W REFLEX MICROSCOPIC - Abnormal; Notable for the following components:   APPearance HAZY (*)    All other components within normal limits  LIPASE, BLOOD  CBC  PREGNANCY,  URINE  HCG, QUANTITATIVE, PREGNANCY  I-STAT BETA HCG BLOOD, ED ([facility], [facility], [facility] ONLY)  GC/CHLAMYDIA PROBE AMP ([place]) NOT AT [facility]    EKG  EKG Interpretation None       Radiology No results found.  Procedures Procedures (including critical care time)  Medications Ordered in ED Medications - No data to display   Initial Impression / Assessment and Plan / ED Course  I have reviewed the triage vital signs and the nursing notes.  Pertinent labs & imaging results that were available during my care of the patient were reviewed by me and considered in my medical decision making (see chart for details).     Patient presenting with several month history of urinary urgency and pain with urination.  Physical exam reassuring, pain is not reproducible on exam.  Pelvic exam shows thick white discharge from the cervix.  Pregnancy negative.  Labs reassuring.  UA negative for infection.  Wet prep shows white cells, signs of yeast infection, BV, or trichomoniasis.  Chlamydia  and gonorrhea sent.  Doubt infection as source of symptoms.  Doubt torsion, this is been going on for many months, pain is not reproducible, pain is only with urination.  Discussed with patient that urinary urgency likely due to MS.  Case discussed with attending, Dr. Ethelda Chick evaluated the patient.  Patient given information for neurologist in [place].  At this time, patient appears safe for discharge.  Return precautions given.  Patient states she understands and agrees to plan.   Final Clinical Impressions(s) / ED Diagnoses   Final diagnoses:  Urinary urgency    ED Discharge Orders    None       Alveria Apley, PA-C 09/21/17 0250    Doug Sou, MD 09/25/17 1301

## 2017-09-21 LAB — GC/CHLAMYDIA PROBE AMP (~~LOC~~) NOT AT ARMC
CHLAMYDIA, DNA PROBE: NEGATIVE
NEISSERIA GONORRHEA: NEGATIVE

## 2017-12-12 ENCOUNTER — Ambulatory Visit: Payer: Self-pay | Admitting: Neurology

## 2018-08-03 ENCOUNTER — Ambulatory Visit (INDEPENDENT_AMBULATORY_CARE_PROVIDER_SITE_OTHER): Payer: Self-pay | Admitting: Physician Assistant

## 2019-10-07 ENCOUNTER — Emergency Department (HOSPITAL_COMMUNITY)
Admission: EM | Admit: 2019-10-07 | Discharge: 2019-10-07 | Disposition: A | Discharge status: Home / Self Care | Payer: Medicare Other | Attending: Emergency Medicine | Admitting: Emergency Medicine

## 2019-10-07 ENCOUNTER — Other Ambulatory Visit: Payer: Self-pay

## 2019-10-07 ENCOUNTER — Encounter (HOSPITAL_COMMUNITY): Payer: Self-pay

## 2019-10-07 DIAGNOSIS — Z87891 Personal history of nicotine dependence: Secondary | ICD-10-CM | POA: Insufficient documentation

## 2019-10-07 DIAGNOSIS — J029 Acute pharyngitis, unspecified: Secondary | ICD-10-CM

## 2019-10-07 LAB — WET PREP, GENITAL
Clue Cells Wet Prep HPF POC: NONE SEEN
Sperm: NONE SEEN
Trich, Wet Prep: NONE SEEN
Yeast Wet Prep HPF POC: NONE SEEN

## 2019-10-07 LAB — URINALYSIS, ROUTINE W REFLEX MICROSCOPIC
Bilirubin Urine: NEGATIVE
Glucose, UA: NEGATIVE mg/dL
Ketones, ur: 20 mg/dL — AB
Nitrite: POSITIVE — AB
Protein, ur: 100 mg/dL — AB
Specific Gravity, Urine: 1.03 (ref 1.005–1.030)
pH: 5 (ref 5.0–8.0)

## 2019-10-07 LAB — POC URINE PREG, ED: Preg Test, Ur: NEGATIVE

## 2019-10-07 MED ORDER — CEFTRIAXONE SODIUM 250 MG IJ SOLR
250.0000 mg | Freq: Once | INTRAMUSCULAR | Status: AC
  Administered 2019-10-07: 250 mg via INTRAMUSCULAR
  Filled 2019-10-07: qty 250

## 2019-10-07 MED ORDER — CLINDAMYCIN HCL 150 MG PO CAPS: 450.0000 mg | ORAL_CAPSULE | Freq: Three times a day (TID) | ORAL | 0 refills | Status: AC

## 2019-10-07 MED ORDER — AZITHROMYCIN 250 MG PO TABS
1000.0000 mg | ORAL_TABLET | Freq: Once | ORAL | Status: AC
  Administered 2019-10-07: 1000 mg via ORAL
  Filled 2019-10-07: qty 4

## 2019-10-07 MED ORDER — ACETAMINOPHEN 500 MG PO TABS
1000.0000 mg | ORAL_TABLET | Freq: Once | ORAL | Status: AC
  Administered 2019-10-07: 1000 mg via ORAL
  Filled 2019-10-07: qty 2

## 2019-10-07 MED ORDER — STERILE WATER FOR INJECTION IJ SOLN
INTRAMUSCULAR | Status: AC
  Administered 2019-10-07: 0.9 mL
  Filled 2019-10-07: qty 10

## 2019-10-07 NOTE — ED Triage Notes (Signed)
[place]. Pt c/o sore throat x 3 days. Pt states she was dealing with chills as well.

## 2019-10-07 NOTE — Discharge Instructions (Addendum)
You can take Tylenol or Ibuprofen as directed for pain. You can alternate Tylenol and Ibuprofen every 4 hours. If you take Tylenol at 1pm, then you can take Ibuprofen at 5pm. Then you can take Tylenol again at 9pm.   Take antibiotics as directed. Please take all of your antibiotics until finished.  You have been treated today for an STD.   The test results with take 2-3 days to return. If there is an abnormal result, you will be notified. If you do not hear anything, that means the results were negative. You can also log on MyChart to see the results.   Your sexual partner needs to be treated too. Do not have sexual intercourse for the next 7 days and after your partner has been treated.   Follow-up with your primary care doctor in 2-4 days. If you do not have a primary care doctor, you can use one listed in the paperwork.   Return to the Emergency Department for any fever, abdominal pain, difficulty breathing, nausea/vomiting or any other worsening or concerning symptoms.   Return the emergency department for any difficulty swallowing, vomiting, difficulty breathing.
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
## 2019-10-07 NOTE — ED Notes (Signed)
INFORMED PT AND HER BOYFRIEND (ALSO A PT) TO MAINTAIN A SAFE DISTANCE. LYING ON OR WITH EACH OTHER WAS NOT MAINTAINING THIS PRACTICE. ALSO INFORMED NO OUTSIDE FOOD. PT [place]. MULTIPLE CONVERSATIONS WITH VERBAL AND PHYSICALS CUES GIVEN. ENCOURAGE PT AND BOYFRIEND TO VERBALIZED TO THIS WRITER ANY CONCERNS.

## 2019-10-07 NOTE — ED Notes (Signed)
PT EATING FOOD AND DRINKING FROM PERSONAL BAG

## 2019-10-08 LAB — RPR: RPR Ser Ql: NONREACTIVE

## 2019-10-08 LAB — HIV ANTIBODY (ROUTINE TESTING W REFLEX): HIV Screen 4th Generation wRfx: NONREACTIVE

## 2019-10-08 NOTE — SANE Note (Signed)
The SANE/FNE Naval architect) consult has been completed. The PA Simonne Martinet has been notified. Please contact the SANE/FNE nurse on call (listed in [place]) with any further concerns.

## 2019-10-09 LAB — GC/CHLAMYDIA PROBE AMP (~~LOC~~) NOT AT ARMC
Chlamydia: NEGATIVE
Neisseria Gonorrhea: NEGATIVE

## 2019-10-24 ENCOUNTER — Other Ambulatory Visit: Payer: Self-pay

## 2019-10-24 ENCOUNTER — Emergency Department (HOSPITAL_COMMUNITY)
Admission: EM | Admit: 2019-10-24 | Discharge: 2019-10-25 | Disposition: A | Discharge status: Other Acute Inpatient Hospital | Payer: Medicaid Other | Attending: Emergency Medicine | Admitting: Emergency Medicine

## 2019-10-24 DIAGNOSIS — R404 Transient alteration of awareness: Secondary | ICD-10-CM

## 2019-10-24 DIAGNOSIS — R4182 Altered mental status, unspecified: Secondary | ICD-10-CM | POA: Diagnosis present

## 2019-10-24 DIAGNOSIS — Z008 Encounter for other general examination: Secondary | ICD-10-CM | POA: Diagnosis not present

## 2019-10-24 DIAGNOSIS — Z20828 Contact with and (suspected) exposure to other viral communicable diseases: Secondary | ICD-10-CM | POA: Insufficient documentation

## 2019-10-24 DIAGNOSIS — Z87891 Personal history of nicotine dependence: Secondary | ICD-10-CM | POA: Diagnosis not present

## 2019-10-24 DIAGNOSIS — F23 Brief psychotic disorder: Secondary | ICD-10-CM | POA: Insufficient documentation

## 2019-10-24 LAB — COMPREHENSIVE METABOLIC PANEL
ALT: 20 U/L (ref 0–44)
AST: 20 U/L (ref 15–41)
Albumin: 3.9 g/dL (ref 3.5–5.0)
Alkaline Phosphatase: 54 U/L (ref 38–126)
Anion gap: 13 (ref 5–15)
BUN: 6 mg/dL (ref 6–20)
CO2: 22 mmol/L (ref 22–32)
Calcium: 9.4 mg/dL (ref 8.9–10.3)
Chloride: 105 mmol/L (ref 98–111)
Creatinine, Ser: 0.65 mg/dL (ref 0.44–1.00)
GFR calc Af Amer: >60 mL/min (ref >60)
GFR calc non Af Amer: >60 mL/min (ref >60)
Glucose, Bld: 93 mg/dL (ref 70–99)
Potassium: 3.4 mmol/L — ABNORMAL LOW (ref 3.5–5.1)
Sodium: 140 mmol/L (ref 135–145)
Total Bilirubin: 1.1 mg/dL (ref 0.3–1.2)
Total Protein: 7.1 g/dL (ref 6.5–8.1)

## 2019-10-24 LAB — CBC
HCT: 38.9 % (ref 36.0–46.0)
Hemoglobin: 13.5 g/dL (ref 12.0–15.0)
MCH: 33.8 pg (ref 26.0–34.0)
MCHC: 34.7 g/dL (ref 30.0–36.0)
MCV: 97.3 fL (ref 80.0–100.0)
Platelets: 381 10*3/uL (ref 150–400)
RBC: 4 MIL/uL (ref 3.87–5.11)
RDW: 13 % (ref 11.5–15.5)
WBC: 11 10*3/uL — ABNORMAL HIGH (ref 4.0–10.5)
nRBC: 0 % (ref 0.0–0.2)

## 2019-10-24 LAB — RESPIRATORY PANEL BY RT PCR (FLU A&B, COVID)
Influenza A by PCR: NEGATIVE
Influenza B by PCR: NEGATIVE
SARS Coronavirus 2 by RT PCR: NEGATIVE

## 2019-10-24 LAB — RAPID URINE DRUG SCREEN, HOSP PERFORMED
Amphetamines: NOT DETECTED
Barbiturates: NOT DETECTED
Benzodiazepines: NOT DETECTED
Cocaine: NOT DETECTED
Opiates: NOT DETECTED
Tetrahydrocannabinol: POSITIVE — AB

## 2019-10-24 LAB — SALICYLATE LEVEL: Salicylate Lvl: <7 mg/dL (ref 2.8–30.0)

## 2019-10-24 LAB — ACETAMINOPHEN LEVEL: Acetaminophen (Tylenol), Serum: <10 ug/mL — ABNORMAL LOW (ref 10–30)

## 2019-10-24 LAB — I-STAT BETA HCG BLOOD, ED (MC, WL, AP ONLY): I-stat hCG, quantitative: <5 m[IU]/mL (ref <5)

## 2019-10-24 LAB — ETHANOL: Alcohol, Ethyl (B): <10 mg/dL (ref <10)

## 2019-10-24 MED ORDER — OLANZAPINE 5 MG PO TBDP
10.0000 mg | ORAL_TABLET | Freq: Every day | ORAL | Status: DC
  Administered 2019-10-24 (×2): 10 mg via ORAL
  Filled 2019-10-24 (×2): qty 2

## 2019-10-24 NOTE — ED Notes (Signed)
Pt named called x3 to be roomed. No response.

## 2019-10-24 NOTE — BHH Counselor (Signed)
TTS contacted [facility] ED to set up telepsych. TTS machine not connecting to [facility] ED cart 1. Spoke with Santiago Glad, RN who states she is attempting to locate cart.
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
## 2019-10-24 NOTE — BHH Counselor (Signed)
Disposition: Mordecai Maes, NP recommends in patient pending medical clearance.

## 2019-10-24 NOTE — ED Notes (Signed)
Pt restful without acute behaviors or acute danger to self/others in hallway area at this time.  No sitter present,but will order.  IVC paperwork to MD.

## 2019-10-24 NOTE — BH Assessment (Signed)
Tele Assessment Note   Patient Name: Emily [name] MRN: [id number] Referring Physician: Adela Lank Location of Patient: [facility] ED Location of Provider: Behavioral Health TTS Department  Emily [name] is an 43 y.o. female presenting voluntarily to [facility] ED with AMS. It is unclear how patient arrived to ED. Per nursing notes: "Pt states she is pregnant. When asked how far along she is, she states "they cut off my communication, I married two marines today". Pt states that she needs to save her babies life. States that she has been smoking and drinking today and her baby "loves the party".  Upon this counselor's exam patient is pleasant, however is a poor historian due AMS. Patient's speech is rapid and with loose associations. Patient states "I had a mental snap. I got married today. It is the best day of my life." Patient is unable to tell me where she lives and with whom. When asked if she has children she states "I think so." She denies SI/HI. Patient reports she used to take medication for depression. She states that she has a therapist named Carver Fila who is the voice in her head and also who she married today. She states "I'm a druggie." When asked about what substances she uses she states "I don't do drugs." UDS is pending at time of assessment. Patient was seen by psychiatry in 2018 for dissociations but was ultimately psych cleared. She has no other documented psych history. Patient gives verbal consent for TTS to contact her husband, Carver Fila, but is unable to provide phone number. Patient reports AH of Mikey's voice encouraging her. She reports VH of "signs" no one else sees.   Patient is alert and oriented x 3. She appears disheveled. Her speech is rambling, eye contact is poor, and her thoughts are disorganized. Patient's mood is pleasant and her affect is congruent. Her insight, judgement, and impulse control are impaired. She does not appear to be responding to internal stimuli during assessment.  Patient appears delusional.  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 MS (multiple sclerosis) (HCC)     Past Surgical History:  Procedure Laterality Date  . ANGIOPLASTY      Family History: No family history on file.  Social History:  reports that she has quit smoking. She has never used smokeless tobacco. She reports current alcohol use. She reports current drug use. Drug: Marijuana.  Additional Social History:  Alcohol / Drug Use Pain Medications: see MAR Prescriptions: see MAR Over the Counter: see MAR History of alcohol / drug use?: No history of alcohol / drug abuse  CIWA: CIWA-Ar BP: (!) 133/91 Pulse Rate: 70 COWS:    Allergies:  Allergies  Allergen Reactions  . Penicillins Anaphylaxis    Has patient had a PCN reaction causing immediate rash, facial/tongue/throat swelling, SOB or lightheadedness with hypotension: No Has patient had a PCN reaction causing severe rash involving mucus membranes or skin necrosis: No Has patient had a PCN reaction that required hospitalization: No Has patient had a PCN reaction occurring within the last 10 years: No If all of the above answers are "NO", then may proceed with Cephalosporin use.    Home Medications: (Not in a hospital admission)   OB/GYN Status:  No LMP recorded.  General Assessment Data Location of Assessment: [facility] ED TTS Assessment: In system Is this a Tele or Face-to-Face Assessment?: Tele Assessment Is this an Initial Assessment or a Re-assessment for this encounter?: Initial  Assessment Patient Accompanied by:: N/A Language Other than English: No Living Arrangements: [place]) What gender do you identify as?: Female Marital status: Other (comment)(states she got married today- UTA) Maiden name: none Pregnancy Status: Unknown Living Arrangements: (UTA) Can pt return to current living arrangement?: Yes Admission Status: Involuntary Petitioner:  ED Attending Is patient capable of signing voluntary admission?: No Referral Source: Self/Family/Friend Insurance type: none     Crisis Care Plan Living Arrangements: (UTA) Legal Guardian: (self) Name of Psychiatrist: none Name of Therapist: none  Education Status Is patient currently in school?: No Is the patient employed, unemployed or receiving disability?: Unemployed  Risk to self with the past 6 months Suicidal Ideation: No Has patient been a risk to self within the past 6 months prior to admission? : No Suicidal Intent: No Has patient had any suicidal intent within the past 6 months prior to admission? : No Is patient at risk for suicide?: No Suicidal Plan?: No Has patient had any suicidal plan within the past 6 months prior to admission? : No Access to Means: No What has been your use of drugs/alcohol within the last 12 months?: denies Previous Attempts/Gestures: No How many times?: 0 Other Self Harm Risks: none Triggers for Past Attempts: None known Intentional Self Injurious Behavior: None Family Suicide History: Unable to assess Recent stressful life event(s): (UTA) Persecutory voices/beliefs?: Yes Depression: No Depression Symptoms: Insomnia Substance abuse history and/or treatment for substance abuse?: No Suicide prevention information given to non-admitted patients: Not applicable  Risk to Others within the past 6 months Homicidal Ideation: No Does patient have any lifetime risk of violence toward others beyond the six months prior to admission? : No Thoughts of Harm to Others: No Current Homicidal Intent: No Current Homicidal Plan: No Access to Homicidal Means: No Identified Victim: none History of harm to others?: No Assessment of Violence: None Noted Violent Behavior Description: (UTA) Does patient have access to weapons?: (UTA) Criminal Charges Pending?: (UTA) Does patient have a court date: (UTA) Is patient on probation?:  Unknown  Psychosis Hallucinations: Auditory, Visual Delusions: Grandiose, Unspecified, Erotomanic  Mental Status Report Appearance/Hygiene: Bizarre Eye Contact: Fair Motor Activity: Freedom of movement Speech: Tangential Level of Consciousness: Alert Mood: Pleasant Affect: Appropriate to circumstance Anxiety Level: Minimal Thought Processes: Flight of Ideas, Tangential Judgement: Impaired Orientation: Person, Place, Time, Situation Obsessive Compulsive Thoughts/Behaviors: None  Cognitive Functioning Concentration: Poor Memory: Unable to Assess Is patient IDD: No Insight: Unable to Assess Impulse Control: Unable to Assess Appetite: Good Have you had any weight changes? : No Change Sleep: Decreased Total Hours of Sleep: (UTA) Vegetative Symptoms: Unable to Assess  ADLScreening [facility] Assessment Services) Patient's cognitive ability adequate to safely complete daily activities?: Yes Patient able to express need for assistance with ADLs?: Yes Independently performs ADLs?: Yes (appropriate for developmental age)  Prior Inpatient Therapy Prior Inpatient Therapy: No  Prior Outpatient Therapy Prior Outpatient Therapy: Yes Prior Therapy Dates: (UTA) Prior Therapy Facilty/Provider(s): (UTA) Reason for Treatment: depression Does patient have an ACCT team?: No Does patient have Intensive In-House Services?  : No Does patient have [facility] services? : No Does patient have P4CC services?: No  ADL Screening (condition at time of admission) Patient's cognitive ability adequate to safely complete daily activities?: Yes Is the patient deaf or have difficulty hearing?: No Does the patient have difficulty seeing, even when wearing glasses/contacts?: No Does the patient have difficulty concentrating, remembering, or making decisions?: No Patient able to express need for assistance with ADLs?: Yes Does the  patient have difficulty dressing or bathing?: No Independently performs ADLs?:  Yes (appropriate for developmental age) Does the patient have difficulty walking or climbing stairs?: No Weakness of Legs: None Weakness of Arms/Hands: None  Home Assistive Devices/Equipment Home Assistive Devices/Equipment: None  Therapy Consults (therapy consults require a physician order) PT Evaluation Needed: No OT Evalulation Needed: No SLP Evaluation Needed: No Abuse/Neglect Assessment (Assessment to be complete while patient is alone) Abuse/Neglect Assessment Can Be Completed: Unable to assess, patient is non-responsive or altered mental status Values / Beliefs Cultural Requests During Hospitalization: None Spiritual Requests During Hospitalization: None Consults Spiritual Care Consult Needed: No Social Work Consult Needed: No Merchant navy officer (For Healthcare) Does Patient Have a Medical Advance Directive?: Unable to assess, patient is non-responsive or altered mental status          Disposition: Denzil Magnuson, NP recommends in patient pending medical clearance. Disposition Initial Assessment Completed for this Encounter: Yes  This service was provided via telemedicine using a 2-way, interactive audio and video technology.  Names of all persons participating in this telemedicine service and their role in this encounter. Name: Destinny Gilkey Role: patient  Name: Celedonio Miyamoto, LCSW Role: TTS  Name:  Role:   Name:  Role:     Celedonio Miyamoto 10/24/2019 11:32 AM

## 2019-10-24 NOTE — BHH Counselor (Signed)
[facility] reviewing 

## 2019-10-24 NOTE — ED Notes (Signed)
Patient was assisted to the bathroom and when she returned started demanding staff listen to her testimony; Pt standing up in the door unsteady on her feet; Pt asked to return to bed; [employer] on stand by as patient has slightly escalated in voice volume-Monique,RN

## 2019-10-24 NOTE — ED Provider Notes (Signed)
[facility] EMERGENCY DEPARTMENT Provider Note   CSN: [id number] Arrival date & time: 10/24/19  0449     History   Chief Complaint Chief Complaint  Patient presents with  . Medical Clearance    HPI Emily [name] is a 43 y.o. female.     43 yo F with a chief complaint that her children are can be taken away from her.  She is not able to really quantify this for me.  States that evil people are going to take her children away from her.  States they have already taken some away previously.  She is very tearful and hard to get a full history from.  Denies any medical complaints.  Denies specifically chest pain shortness of breath abdominal pain vomiting or diarrhea.  Denies fevers.  Denies headaches.  The history is provided by the patient.  Illness Severity:  Mild Onset quality:  Sudden Duration:  2 days Timing:  Constant Progression:  Worsening Chronicity:  New Associated symptoms: no chest pain, no congestion, no fever, no headaches, no myalgias, no nausea, no rhinorrhea, no shortness of breath, no vomiting and no wheezing     Past Medical History:  Diagnosis Date  . MS (multiple sclerosis) [facility])     Patient Active Problem List   Diagnosis Date Noted  . Abnormal MRI   . Altered mental status   . 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 Acute encephalopathy 02/21/2017    Past Surgical History:  Procedure Laterality Date  . ANGIOPLASTY       OB History   No obstetric history on file.      Home Medications    Prior to Admission medications   Medication Sig Start Date End Date Taking? Authorizing Provider  acetaminophen (TYLENOL) 500 MG tablet Take 1 tablet (500 mg total) by mouth every 6 (six) hours as needed. Patient not taking: Reported on 09/20/2017 04/01/17   Michela Pitcher A, PA-C  ibuprofen (ADVIL,MOTRIN) 600 MG tablet Take 1 tablet (600 mg total) by mouth every 6 (six) hours as needed. Patient not taking:  Reported on 09/20/2017 04/01/17   Jeanie Sewer, PA-C    Family History No family history on file.  Social History Social History   Tobacco Use  . Smoking status: Former Games developer  . Smokeless tobacco: Never Used  Substance Use Topics  . Alcohol use: Yes  . Drug use: Yes    Types: Marijuana     Allergies   Penicillins   Review of Systems Review of Systems  Constitutional: Negative for chills and fever.  HENT: Negative for congestion and rhinorrhea.   Eyes: Negative for redness and visual disturbance.  Respiratory: Negative for shortness of breath and wheezing.   Cardiovascular: Negative for chest pain and palpitations.  Gastrointestinal: Negative for nausea and vomiting.  Genitourinary: Negative for dysuria and urgency.  Musculoskeletal: Negative for arthralgias and myalgias.  Skin: Negative for pallor and wound.  Neurological: Negative for dizziness and headaches.  Psychiatric/Behavioral: Positive for agitation and behavioral problems.     Physical Exam Updated Vital Signs BP (!) 133/91 (BP Location: Left Arm)   Pulse 70   Temp 98 F (36.7 C) (Oral)   Resp 18   SpO2 100%   Physical Exam Vitals signs and nursing note reviewed.  Constitutional:      General: She is not in acute distress.    Appearance: She is well-developed. She is not diaphoretic.  HENT:  Head: Normocephalic and atraumatic.  Eyes:     Pupils: Pupils are equal, round, and reactive to light.  Neck:     Musculoskeletal: Normal range of motion and neck supple.  Cardiovascular:     Rate and Rhythm: Normal rate and regular rhythm.     Heart sounds: No murmur. No friction rub. No gallop.   Pulmonary:     Effort: Pulmonary effort is normal.     Breath sounds: No wheezing or rales.  Abdominal:     General: There is no distension.     Palpations: Abdomen is soft.     Tenderness: There is no abdominal tenderness.  Musculoskeletal:        General: No tenderness.  Skin:    General: Skin is  warm and dry.  Neurological:     Mental Status: She is alert and oriented to person, place, and time.  Psychiatric:        Mood and Affect: Mood is anxious. Affect is tearful.        Behavior: Behavior normal.      ED Treatments / Results  Labs (all labs ordered are listed, but only abnormal results are displayed) Labs Reviewed  COMPREHENSIVE METABOLIC PANEL - Abnormal; Notable for the following components:      Result Value   Potassium 3.4 (*)    All other components within normal limits  ACETAMINOPHEN LEVEL - Abnormal; Notable for the following components:   Acetaminophen (Tylenol), Serum <10 (*)    All other components within normal limits  CBC - Abnormal; Notable for the following components:   WBC 11.0 (*)    All other components within normal limits  RAPID URINE DRUG SCREEN, HOSP PERFORMED - Abnormal; Notable for the following components:   Tetrahydrocannabinol POSITIVE (*)    All other components within normal limits  RESPIRATORY PANEL BY RT PCR (FLU A&B, COVID)  SARS CORONAVIRUS 2 (TAT 6-24 HRS)  ETHANOL  SALICYLATE LEVEL  I-STAT BETA HCG BLOOD, ED ([facility], [facility], [facility] ONLY)    EKG None  Radiology No results found.  Procedures Procedures (including critical care time)  Medications Ordered in ED Medications  OLANZapine zydis (ZYPREXA) disintegrating tablet 10 mg (10 mg Oral Given 10/24/19 1456)     Initial Impression / Assessment and Plan / ED Course  I have reviewed the triage vital signs and the nursing notes.  Pertinent labs & imaging results that were available during my care of the patient were reviewed by me and considered in my medical decision making (see chart for details).        43 yo F with a chief complaints of altered mental status.  Patient has a history of the same.  Was seen in the ED in April 2018 was found to have no organic cause and was believed to be caused by a psychiatric disorder.  She was deemed to be safe for discharge and was  discharged at that time.  She has no obvious neurologic deficit on my exam. Seems likely psych related.  Feel medically clear awaiting TTS eval.  Signed out to Dr. Renaye Rakers, please see his note for further details of care.   4:09 PM:  I have discussed the diagnosis/risks/treatment options with the patient and believe the pt to be eligible for discharge home to follow-up with PCP. We also discussed returning to the ED immediately if new or worsening sx occur. We discussed the sx which are most concerning (e.g., sudden worsening pain, fever, inability to tolerate by  mouth) that necessitate immediate return. Medications administered to the patient during their visit and any new prescriptions provided to the patient are listed below.  Medications given during this visit Medications  OLANZapine zydis (ZYPREXA) disintegrating tablet 10 mg (10 mg Oral Given 10/24/19 1456)     The patient appears reasonably screen and/or stabilized for discharge and I doubt any other medical condition or other [facility] requiring further screening, evaluation, or treatment in the ED at this time prior to discharge.    Final Clinical Impressions(s) / ED Diagnoses   Final diagnoses:  None    ED Discharge Orders    None       Deno Etienne, DO 10/24/19 1609

## 2019-10-24 NOTE — ED Notes (Signed)
Pt set up for TTs at this time, rambling speech with loose associations.  States she is a child of God and the devil has been after her.  She overcame him since she is here at the ED and has saved her children.  Pt does states that she walked here and would like to go to [facility].   Pt dissheveled, smells strongly of urine.  Pleasant and cooperative with RN.

## 2019-10-25 ENCOUNTER — Other Ambulatory Visit: Payer: Self-pay

## 2019-10-25 ENCOUNTER — Encounter (HOSPITAL_COMMUNITY): Payer: Self-pay | Admitting: Psychiatry

## 2019-10-25 ENCOUNTER — Inpatient Hospital Stay (HOSPITAL_COMMUNITY)
Admission: AD | Admit: 2019-10-25 | Discharge: 2019-10-30 | DRG: 885 | Disposition: A | Discharge status: Home / Self Care | Payer: No Typology Code available for payment source | Source: Intra-hospital | Attending: Psychiatry | Admitting: Psychiatry

## 2019-10-25 DIAGNOSIS — F1220 Cannabis dependence, uncomplicated: Secondary | ICD-10-CM | POA: Diagnosis present

## 2019-10-25 DIAGNOSIS — F419 Anxiety disorder, unspecified: Secondary | ICD-10-CM | POA: Diagnosis present

## 2019-10-25 DIAGNOSIS — W010XXA Fall on same level from slipping, tripping and stumbling without subsequent striking against object, initial encounter: Secondary | ICD-10-CM | POA: Diagnosis not present

## 2019-10-25 DIAGNOSIS — S79912A Unspecified injury of left hip, initial encounter: Secondary | ICD-10-CM | POA: Diagnosis present

## 2019-10-25 DIAGNOSIS — F23 Brief psychotic disorder: Secondary | ICD-10-CM | POA: Diagnosis present

## 2019-10-25 DIAGNOSIS — G4700 Insomnia, unspecified: Secondary | ICD-10-CM | POA: Diagnosis present

## 2019-10-25 DIAGNOSIS — Z87891 Personal history of nicotine dependence: Secondary | ICD-10-CM | POA: Diagnosis not present

## 2019-10-25 DIAGNOSIS — G35 Multiple sclerosis: Secondary | ICD-10-CM | POA: Diagnosis present

## 2019-10-25 DIAGNOSIS — S7002XA Contusion of left hip, initial encounter: Secondary | ICD-10-CM | POA: Diagnosis not present

## 2019-10-25 DIAGNOSIS — Y9289 Other specified places as the place of occurrence of the external cause: Secondary | ICD-10-CM | POA: Diagnosis not present

## 2019-10-25 DIAGNOSIS — F250 Schizoaffective disorder, bipolar type: Secondary | ICD-10-CM | POA: Diagnosis present

## 2019-10-25 DIAGNOSIS — Y998 Other external cause status: Secondary | ICD-10-CM | POA: Diagnosis not present

## 2019-10-25 DIAGNOSIS — F259 Schizoaffective disorder, unspecified: Secondary | ICD-10-CM

## 2019-10-25 DIAGNOSIS — F1210 Cannabis abuse, uncomplicated: Secondary | ICD-10-CM | POA: Diagnosis not present

## 2019-10-25 DIAGNOSIS — Y9301 Activity, walking, marching and hiking: Secondary | ICD-10-CM | POA: Diagnosis not present

## 2019-10-25 MED ORDER — OLANZAPINE 10 MG PO TBDP
10.0000 mg | ORAL_TABLET | Freq: Every day | ORAL | Status: DC
  Administered 2019-10-25: 21:00:00 10 mg via ORAL
  Filled 2019-10-25 (×3): qty 1

## 2019-10-25 NOTE — ED Notes (Signed)
Pt was found to be hiding something in her mouth. When asked to open her mouth, she transferred what looked like keys into her right pocket. This RN continued to ask patient return the item. Patient then put it back in her mouth stating "I'm going to swallow it, Daddy asked me to swallow it."  PT gave up one pair of key. Unsure where she got them from. She states "daddy asked me to swallow it."

## 2019-10-25 NOTE — Progress Notes (Signed)
Patient ID: Emily [name], female   DOB: 07-Apr-1976, 43 y.o.   MRN: [id number] Patient bizarre and talking to self during assessment.  Patient found to have a broken spoon in her socks which was confiscated during her skin assessment. Patient had small surgical scar no right side of her back.  Patient admitted to the unit without incident.

## 2019-10-25 NOTE — Progress Notes (Signed)
Pt is accepted to [facility]; bed 504-2    Dr. Dwyane Dee is the accepting provider.    Dr. Mallie Darting is the attending provider.    Call report to [phone]    [facility] ED notified.     Pt is voluntary and can be transported by [facility]   Pt is scheduled to arrive at [facility] at [place], [place], [place] Disposition [facility]/TTS [phone]

## 2019-10-25 NOTE — ED Notes (Signed)
Breakfast Ordered 

## 2019-10-25 NOTE — ED Notes (Signed)
Safe[facility]Transport went to [facility] instead of [employer] re-route to transport patient.

## 2019-10-25 NOTE — ED Notes (Signed)
Report called to [facility] 

## 2019-10-25 NOTE — Progress Notes (Signed)
   10/25/19 2300  Psych Admission Type (Psych Patients Only)  Admission Status Voluntary  Psychosocial Assessment  Patient Complaints Anxiety;Worrying  Eye Contact Fair  Facial Expression Anxious  Affect Appropriate to circumstance  Speech Tangential  Interaction Assertive  Motor Activity Slow  Appearance/Hygiene Disheveled  Behavior Characteristics Anxious  Mood Preoccupied  Thought Process  Coherency Disorganized;Loose associations;Flight of ideas  Content Religiosity;Paranoia  Delusions Religious  Perception WDL  Hallucination None reported or observed  Judgment Poor  Confusion Moderate  Danger to Self  Current suicidal ideation? Denies  Danger to Others  Danger to Others None reported or observed

## 2019-10-25 NOTE — Tx Team (Signed)
Initial Treatment Plan 10/25/2019 6:42 PM ELNER SEIFERT RZN:[id number]    PATIENT STRESSORS: Marital or family conflict   PATIENT STRENGTHS: Communication skills   PATIENT IDENTIFIED PROBLEMS: "I am not trying to hurt nobody"                     DISCHARGE CRITERIA:  Improved stabilization in mood, thinking, and/or behavior  PRELIMINARY DISCHARGE PLAN: Return to previous living arrangement  PATIENT/FAMILY INVOLVEMENT: This treatment plan has been presented to and reviewed with the patient, SUNSHINE MACKOWSKI, and/or family member. The patient and family have been given the opportunity to ask questions and make suggestions.  Clarita Crane, RN 10/25/2019, 6:42 PM

## 2019-10-26 DIAGNOSIS — F250 Schizoaffective disorder, bipolar type: Principal | ICD-10-CM

## 2019-10-26 DIAGNOSIS — F259 Schizoaffective disorder, unspecified: Secondary | ICD-10-CM

## 2019-10-26 DIAGNOSIS — F23 Brief psychotic disorder: Secondary | ICD-10-CM

## 2019-10-26 LAB — LIPID PANEL
Cholesterol: 152 mg/dL (ref 0–200)
HDL: 44 mg/dL (ref >40)
LDL Cholesterol: 96 mg/dL (ref 0–99)
Total CHOL/HDL Ratio: 3.5 RATIO
Triglycerides: 60 mg/dL (ref <150)
VLDL: 12 mg/dL (ref 0–40)

## 2019-10-26 LAB — TSH: TSH: 0.845 u[IU]/mL (ref 0.350–4.500)

## 2019-10-26 LAB — HEMOGLOBIN A1C
Hgb A1c MFr Bld: 5.2 % (ref 4.8–5.6)
Mean Plasma Glucose: 102.54 mg/dL

## 2019-10-26 MED ORDER — HALOPERIDOL 5 MG PO TABS
5.0000 mg | ORAL_TABLET | Freq: Four times a day (QID) | ORAL | Status: DC | PRN
  Administered 2019-10-28: 5 mg via ORAL
  Filled 2019-10-26: qty 1

## 2019-10-26 MED ORDER — LORAZEPAM 2 MG/ML IJ SOLN: 2.0000 mg | INTRAMUSCULAR | Status: DC | PRN

## 2019-10-26 MED ORDER — OLANZAPINE 10 MG PO TBDP
20.0000 mg | ORAL_TABLET | Freq: Every day | ORAL | Status: DC
  Administered 2019-10-26 – 2019-10-29 (×4): 20 mg via ORAL
  Filled 2019-10-26 (×7): qty 2

## 2019-10-26 MED ORDER — HYDROXYZINE HCL 25 MG PO TABS
25.0000 mg | ORAL_TABLET | Freq: Three times a day (TID) | ORAL | Status: DC | PRN
  Administered 2019-10-27 – 2019-10-29 (×3): 25 mg via ORAL
  Filled 2019-10-26 (×3): qty 1

## 2019-10-26 MED ORDER — LORAZEPAM 1 MG PO TABS
2.0000 mg | ORAL_TABLET | Freq: Four times a day (QID) | ORAL | Status: DC | PRN
  Administered 2019-10-28: 08:00:00 2 mg via ORAL
  Filled 2019-10-26: qty 2

## 2019-10-26 MED ORDER — TRAZODONE HCL 100 MG PO TABS
200.0000 mg | ORAL_TABLET | Freq: Every evening | ORAL | Status: DC | PRN
  Administered 2019-10-26: 21:00:00 200 mg via ORAL
  Filled 2019-10-26: qty 2

## 2019-10-26 MED ORDER — HALOPERIDOL LACTATE 5 MG/ML IJ SOLN: 10.0000 mg | Freq: Four times a day (QID) | INTRAMUSCULAR | Status: DC | PRN

## 2019-10-26 NOTE — Tx Team (Signed)
Interdisciplinary Treatment and Diagnostic Plan Update  10/26/2019 Time of Session: 10:00am STARSHA MORNING MRN: [id number]  Principal Diagnosis: <principal problem not specified>  Secondary Diagnoses: Active Problems:   Brief psychotic disorder (HCC)   Schizoaffective disorder, bipolar type (HCC)   Current Medications:  Current Facility-Administered Medications  Medication Dose Route Frequency Provider Last Rate Last Admin  . haloperidol (HALDOL) tablet 5 mg  5 mg Oral Q6H PRN Johnn Hai, MD       Or  . haloperidol lactate (HALDOL) injection 10 mg  10 mg Intramuscular Q6H PRN Johnn Hai, MD      . hydrOXYzine (ATARAX/VISTARIL) tablet 25 mg  25 mg Oral TID PRN Johnn Hai, MD      . LORazepam (ATIVAN) tablet 2 mg  2 mg Oral Q6H PRN Johnn Hai, MD       Or  . LORazepam (ATIVAN) injection 2 mg  2 mg Intramuscular Q4H PRN Johnn Hai, MD      . OLANZapine zydis (ZYPREXA) disintegrating tablet 20 mg  20 mg Oral QHS Johnn Hai, MD      . traZODone (DESYREL) tablet 200 mg  200 mg Oral QHS PRN Johnn Hai, MD       PTA Medications: No medications prior to admission.    Patient Stressors: Marital or family conflict  Patient Strengths: Communication skills  Treatment Modalities: Medication Management, Group therapy, Case management,  1 to 1 session with clinician, Psychoeducation, Recreational therapy.   Physician Treatment Plan for Primary Diagnosis: <principal problem not specified> Long Term Goal(s):     Short Term Goals:    Medication Management: Evaluate patient's response, side effects, and tolerance of medication regimen.  Therapeutic Interventions: 1 to 1 sessions, Unit Group sessions and Medication administration.  Evaluation of Outcomes: Not Met  Physician Treatment Plan for Secondary Diagnosis: Active Problems:   Brief psychotic disorder ([place])   Schizoaffective disorder, bipolar type ([place])  Long Term Goal(s):     Short Term Goals:        Medication Management: Evaluate patient's response, side effects, and tolerance of medication regimen.  Therapeutic Interventions: 1 to 1 sessions, Unit Group sessions and Medication administration.  Evaluation of Outcomes: Not Met   RN Treatment Plan for Primary Diagnosis: <principal problem not specified> Long Term Goal(s): Knowledge of disease and therapeutic regimen to maintain health will improve  Short Term Goals: Ability to verbalize feelings will improve, Ability to identify and develop effective coping behaviors will improve and Compliance with prescribed medications will improve  Medication Management: RN will administer medications as ordered by provider, will assess and evaluate patient's response and provide education to patient for prescribed medication. RN will report any adverse and/or side effects to prescribing provider.  Therapeutic Interventions: 1 on 1 counseling sessions, Psychoeducation, Medication administration, Evaluate responses to treatment, Monitor vital signs and CBGs as ordered, Perform/monitor CIWA, COWS, AIMS and Fall Risk screenings as ordered, Perform wound care treatments as ordered.  Evaluation of Outcomes: Not Met   LCSW Treatment Plan for Primary Diagnosis: <principal problem not specified> Long Term Goal(s): Safe transition to appropriate next level of care at discharge, Engage patient in therapeutic group addressing interpersonal concerns.  Short Term Goals: Engage patient in aftercare planning with referrals and resources, Facilitate acceptance of mental health diagnosis and concerns, Identify triggers associated with mental health/substance abuse issues and Increase skills for wellness and recovery  Therapeutic Interventions: Assess for all discharge needs, 1 to 1 time with Social worker, Explore available resources and support systems,  Assess for adequacy in community support network, Educate family and significant other(s) on suicide  prevention, Complete Psychosocial Assessment, Interpersonal group therapy.  Evaluation of Outcomes: Not Met   Progress in Treatment: Attending groups: No. New to unit. Participating in groups: No. Taking medication as prescribed: Yes. Toleration medication: Yes. Family/Significant other contact made: No, will contact:  supports if consents are granted. Patient understands diagnosis: No. Discussing patient identified problems/goals with staff: No. Medical problems stabilized or resolved: No. Denies suicidal/homicidal ideation: Yes. Issues/concerns per patient self-inventory: No.   New problem(s) identified: No, Describe:  patient newly admitted to unit, CSW assessing  New Short Term/Long Term Goal(s): medication management for mood stabilization; elimination of SI thoughts; development of comprehensive mental wellness/sobriety plan.  Patient Goals:  Not able to state a goal at this time due to psychosis.  Discharge Plan or Barriers: Patient newly admitted to unit. CSW assessing for appropriate referrals.   Reason for Continuation of Hospitalization: Delusions  Hallucinations Mania Medication stabilization  Estimated Length of Stay: 5-7 days  Attendees: Patient:  10/26/2019 11:03 AM  Physician: Dr.Farah 10/26/2019 11:03 AM  Nursing: Benjamine Mola, RN 10/26/2019 11:03 AM  RN Care Manager: 10/26/2019 11:03 AM  Social Worker: Stephanie Acre, [place] 10/26/2019 11:03 AM  Recreational Therapist:  10/26/2019 11:03 AM  Other:  10/26/2019 11:03 AM  Other:  10/26/2019 11:03 AM  Other: 10/26/2019 11:03 AM    Scribe for Treatment Team: Joellen [place], [place] 10/26/2019 11:03 AM

## 2019-10-26 NOTE — Progress Notes (Signed)
Recreation Therapy Notes  INPATIENT RECREATION THERAPY ASSESSMENT  Patient Details Name: Emily [name] MRN: [id number] DOB: [birth year] Today's Date: 10/26/2019       Information Obtained From: Patient  Able to Participate in Assessment/Interview: Yes  Patient Presentation: Alert  Reason for Admission (Per Patient): Other (Comments)(Pt stated she was pregnant, was walking, got tired and fell out.)  Patient Stressors: Family, Other (Comment)(Father isn't well; Life)  Coping Skills:   TV, Sports, Arguments, Aggression, Music, Exercise, Meditate, Deep Breathing, Substance Abuse, Talk, Art, Prayer, Other (Comment), Avoidance, Read, Dance, Hot Bath/Shower(Knitting; Crochet)  Leisure Interests (2+):  Crafts - Knitting/Crocheting, Music - Other (Comment), Individual - Reading, Exercise - Walking(Dance)  Frequency of Recreation/Participation: Other (Comment)(Weekly- Crochet, Dance; Daily- Knitting, Read, Walk)  Awareness of Community Resources:  No  Expressed Interest in [employer] Information: No  [place] of Residence:  [place]  Patient Main Form of Transportation: Walk  Patient Strengths:  Kind; Compassionate, Funny  Patient Identified Areas of Improvement:  Organization  Patient Goal for Hospitalization:  "to get rubic cube (brain) fixed"  Current SI (including self-harm):  No  Current HI:  No  Current AVH: No  Staff Intervention Plan: Group Attendance, Collaborate with Interdisciplinary Treatment Team  Consent to Intern Participation: N/A   Victorino Sparrow, LRT/CTRS  Victorino Sparrow A 10/26/2019, 12:43 PM

## 2019-10-26 NOTE — Progress Notes (Signed)
The patient rated her day as a 5 out of a possible 10. She states that she is upset over not being able to recall her father's phone number. Her goal for tomorrow is to "stay alive". She also states that she was able to socialize with her peers .

## 2019-10-26 NOTE — BHH Suicide Risk Assessment (Signed)
[facility] Admission Suicide Risk Assessment   Nursing information obtained from:  Patient Demographic factors:  NA Current Mental Status:  NA Loss Factors:  NA Historical Factors:  NA Risk Reduction Factors:  NA  Total Time spent with patient: 45 minutes Principal Problem: <principal problem not specified> Diagnosis:  Active Problems:   Brief psychotic disorder (HCC)   Schizoaffective disorder, bipolar type (HCC)  Subjective Data: see eval  Continued Clinical Symptoms:  Alcohol Use Disorder Identification Test Final Score (AUDIT): 0 The "Alcohol Use Disorders Identification Test", Guidelines for Use in Primary Care, Second Edition.  World Pharmacologist [facility]). Score between 0-7:  no or low risk or alcohol related problems. Score between 8-15:  moderate risk of alcohol related problems. Score between 16-19:  high risk of alcohol related problems. Score 20 or above:  warrants further diagnostic evaluation for alcohol dependence and treatment.   CLINICAL FACTORS:   Previous Psychiatric Diagnoses and Treatments Musculoskeletal: Strength & Muscle Tone: within normal limits Gait & Station: normal Patient leans: N/A  Psychiatric Specialty Exam: Physical Exam  Nursing note and vitals reviewed. Constitutional: She appears well-developed and well-nourished.  Cardiovascular: Normal rate and regular rhythm.    Review of Systems  Constitutional: Negative.   Eyes: Negative.   Respiratory: Negative.   Cardiovascular: Negative.   Endocrine: Negative.   Genitourinary: Negative.   Neurological: Negative.     Blood pressure (!) 146/128, pulse (!) 128, temperature 98.8 F (37.1 C), temperature source Oral, resp. rate 18, height 5\' 4"  (1.626 m), weight 74.4 kg, SpO2 100 %.Body mass index is 28.15 kg/m.  General Appearance: Casual  Eye Contact:  Absent  Speech: Soft but normal rate  Volume:  Decreased  Mood:  Generally contained  Affect:  Congruent  Thought Process:   Disorganized and Irrelevant  Orientation:  Full (Time, Place, and Person)  Thought Content:  Illogical and Delusions  Suicidal Thoughts:  No  Homicidal Thoughts:  No  Memory:  Immediate;   Fair Recent;   Fair Remote;   Fair  Judgement:  Impaired  Insight:  Lacking  Psychomotor Activity:  Normal  Concentration:  Concentration: Fair and Attention Span: Fair  Recall:  [employer] of Knowledge:  Poor  Language:  Fair  Akathisia:  Negative  Handed: States she is ambidextrous  AIMS (if indicated):     Assets:  Armed forces logistics/support/administrative officer Desire for Improvement  ADL's:  Intact  Cognition:  WNL  Sleep:  Number of Hours: 5.75      COGNITIVE FEATURES THAT CONTRIBUTE TO RISK:  Loss of executive function    SUICIDE RISK:   Minimal: No identifiable suicidal ideation.  Patients presenting with no risk factors but with morbid ruminations; may be classified as minimal risk based on the severity of the depressive symptoms  PLAN OF CARE: admit   I certify that inpatient services furnished can reasonably be expected to improve the patient's condition.   Johnn Hai, MD 10/26/2019, 12:53 PM

## 2019-10-26 NOTE — Progress Notes (Signed)
Recreation Therapy Notes  Date: 12.11.20 Time: 1000 Location: 500 Hall Dayroom  Group Topic: Self-Esteem  Goal Area(s) Addresses:  Patient will successfully identify positive attributes about themselves.  Patient will successfully identify benefit of improved self-esteem.   Behavioral Response: Engaged  Intervention: Visual merchandiser, colored pencils, music  Activity: Personalized License Plate.  Patients were to create a personalized license plate that focused on the good things about themselves.  Patients could also highlight important dates, accomplishments or things hoped to accomplish.  Education:  Self-Esteem, Dentist.   Education Outcome: Acknowledges education/In group clarification offered/Needs additional education  Clinical Observations/Feedback:  Pt was rocking along to the music and expressed her love of "dark chocolate" and wrote "bad ass toys ain't 4 boys".    Victorino Sparrow, LRT/CTRS     Victorino Sparrow A 10/26/2019 11:45 AM

## 2019-10-26 NOTE — Progress Notes (Signed)
   10/26/19 2100  Psych Admission Type (Psych Patients Only)  Admission Status Voluntary  Psychosocial Assessment  Patient Complaints Anxiety  Eye Contact Fair  Facial Expression Anxious  Affect Appropriate to circumstance  Speech Tangential  Interaction Assertive  Motor Activity Slow  Appearance/Hygiene Disheveled  Behavior Characteristics Cooperative  Mood Preoccupied  Thought Process  Coherency Disorganized;Loose associations;Flight of ideas  Content Religiosity;Paranoia  Delusions Religious  Perception WDL  Hallucination None reported or observed  Judgment Poor  Confusion Moderate  Danger to Self  Current suicidal ideation? Denies  Danger to Others  Danger to Others None reported or observed   Pt concerned about sleeping. Pt visible on the unit this evening

## 2019-10-26 NOTE — BHH Counselor (Addendum)
Patient newly admitted to unit, she remains pleasantly delusional and responds tangentially when engaged in conversation.  CSW will attempt to complete PSA with patient at a later time.   Shelter resources were provided to patient- as she is trying to get in contact with someone at [employer].  Stephanie Acre, MSW, [place] Social Worker [facility]  [phone]

## 2019-10-26 NOTE — Progress Notes (Signed)
Patient spoke with CSW on unit to request assistance in obtaining her father's contact information. Per patient, her father is stay at the [facility] through [facility]. CSW unable to reach staff by phone to try and obtain patients father's cell phone number.  Stephanie Acre, MSW, [place] Social Worker [facility]  [phone]

## 2019-10-26 NOTE — H&P (Signed)
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  She has had confusional episodes as well in the past, and a suspected of being dependent on cannabis due to this positive drug screen and her report of frequent use but she will not elaborate beyond that.  At any rate she was admitted due to a confusional psychosis and delusional believes  Her chief complaint to me is "I am with the child" and then she rambles and makes disjointed statements that are unrelated to the interview questions.  She had presented in April 2018 and a confusional state, and her MRI of the brain showed   ..." innumerable (predominantly) white matter lesions associated with global brain atrophy, are not associated with breakdown of the blood-brain barrier..." And multiple sclerosis was suspected.  When she presented on 12/9 she once again complained of pregnancy and made bizarre and delusional statements, some of her statements were hyper religious that she was "the child of God and the devil is been after her" at 1 point she even called the nurses in and put a key in her mouth threatening to swallow it but was able to eventually take the key out of her mouth while she was in the observation area  Again she is alert oriented to person place situation will not elaborate day date or time makes hyper religious and delusional statements to me, denies wanting to harm self or  others, states she "flipped" states she has a mental illness and "needs help" at the end of the interview so she does have some degree of insight.  She understands what it is to contract for safety and will contract for safety here.  According to the informative telehealth assessment note of 12/9 by Ms. Turner: Emily [name] is an 43 y.o. female presenting voluntarily to [facility] ED with AMS. It is unclear how patient arrived to ED. Per nursing notes: "Pt states she is pregnant. When asked how far along she is, she states "they cut off my communication, I married two marines today". Pt states that she needs to save her babies life. States that she has been smoking and drinking today and her baby "loves the party".  Upon this counselor's exam patient is pleasant, however is a poor historian due AMS. Patient's speech is rapid and with loose associations. Patient states "I had a mental snap. I got married today. It is the best day of my life." Patient is unable to tell me where she lives and with whom. When asked if she has children she states "I think so." She denies SI/HI. Patient reports she used to take medication for depression. She states that she has a therapist named Bronson Ing who is the voice in her head and also who she married today. She states "I'm a druggie." When asked about what substances she uses she states "I don't do drugs." UDS is pending at time of assessment. Patient was seen by psychiatry in 2018 for dissociations but was ultimately psych cleared. She has no other  documented psych history. Patient gives verbal consent for TTS to contact her husband, Carver Fila, but is unable to provide phone number. Patient reports AH of Mikey's voice encouraging her. She reports VH of "signs" no one else sees.   Patient is alert and oriented x 3. She appears disheveled. Her speech is rambling, eye contact is poor, and her thoughts are disorganized. Patient's mood is pleasant and her affect is congruent. Her  insight, judgement, and impulse control are impaired. She does not appear to be responding to internal stimuli during assessment. Patient appears delusional.      Associated Signs/Symptoms: Depression Symptoms:  impaired memory, (Hypo) Manic Symptoms:  Delusions, Distractibility, Flight of Ideas, Anxiety Symptoms:  n/a Psychotic Symptoms:  Delusions, PTSD Symptoms: NA Total Time spent with patient: 45 minutes  Past Psychiatric History: Prior confusional episodes  Is the patient at risk to self? Yes.    Has the patient been a risk to self in the past 6 months? No.  Has the patient been a risk to self within the distant past? Yes.    Is the patient a risk to others? No.  Has the patient been a risk to others in the past 6 months? No.  Has the patient been a risk to others within the distant past? No.   Prior Inpatient Therapy:   Prior Outpatient Therapy:    Alcohol Screening: 1. How often do you have a drink containing alcohol?: Never 2. How many drinks containing alcohol do you have on a typical day when you are drinking?: 1 or 2 3. How often do you have six or more drinks on one occasion?: Never AUDIT-C Score: 0 4. How often during the last year have you found that you were not able to stop drinking once you had started?: Never 5. How often during the last year have you failed to do what was normally expected from you becasue of drinking?: Never 6. How often during the last year have you needed a first drink in the morning to get yourself going after a heavy drinking session?: Never 7. How often during the last year have you had a feeling of guilt of remorse after drinking?: Never 8. How often during the last year have you been unable to remember what happened the night before because you had been drinking?: Never 9. Have you or someone else been injured as a result of your drinking?: No 10. Has a relative or friend or a doctor or another health worker been concerned about  your drinking or suggested you cut down?: No Alcohol Use Disorder Identification Test Final Score (AUDIT): 0 Substance Abuse History in the last 12 months:  Yes.   Consequences of Substance Abuse: Medical Consequences:  Certainly a contribution of cannabis dependency to her presentation Previous Psychotropic Medications: Yes  Psychological Evaluations: No  Past Medical History:  Past Medical History:  Diagnosis Date  . MS (multiple sclerosis) (HCC)     Past Surgical History:  Procedure Laterality Date  . ANGIOPLASTY     Family History: History reviewed. No pertinent family history. Family Psychiatric  History: Known Tobacco Screening:   Social History:  Social History   Substance and Sexual Activity  Alcohol Use Yes     Social History   Substance and Sexual Activity  Drug Use Yes  . Types: Marijuana    Additional Social History:  Allergies:   Allergies  Allergen Reactions  . Penicillins Anaphylaxis    Has patient had a PCN reaction causing immediate rash, facial/tongue/throat swelling, SOB or lightheadedness with hypotension: No Has patient had a PCN reaction causing severe rash involving mucus membranes or skin necrosis: No Has patient had a PCN reaction that required hospitalization: No Has patient had a PCN reaction occurring within the last 10 years: No If all of the above answers are "NO", then may proceed with Cephalosporin use.   Lab Results:  Results for orders placed or performed during the hospital encounter of 10/25/19 (from the past 48 hour(s))  Hemoglobin A1c     Status: None   Collection Time: 10/26/19  6:30 AM  Result Value Ref Range   Hgb A1c MFr Bld 5.2 4.8 - 5.6 %    Comment: (NOTE) Pre diabetes:          5.7%-6.4% Diabetes:              >6.4% Glycemic control for   <7.0% adults with diabetes    Mean Plasma Glucose 102.54 mg/dL    Comment: Performed at [facility], [address] [place],  [place] [address]  Lipid panel     Status: None   Collection Time: 10/26/19  6:30 AM  Result Value Ref Range   Cholesterol 152 0 - 200 mg/dL   Triglycerides 60 <604<150 mg/dL   HDL 44 >54>40 mg/dL   Total CHOL/HDL Ratio 3.5 RATIO   VLDL 12 0 - 40 mg/dL   LDL Cholesterol 96 0 - 99 mg/dL    Comment:        Total Cholesterol/HDL:CHD Risk Coronary Heart Disease Risk Table                     Men   Women  1/2 Average Risk   3.4   3.3  Average Risk       5.0   4.4  2 X Average Risk   9.6   7.1  3 X Average Risk  23.4   11.0        Use the calculated Patient Ratio above and the CHD Risk Table to determine the patient's CHD Risk.        ATP III CLASSIFICATION (LDL):  <100     mg/dL   Optimal  [id number]  mg/dL   Near or Above                    Optimal  130-159  mg/dL   Borderline  [id number]  mg/dL   High  >562>190     mg/dL   Very High Performed at [facility], [address] [place], [place] [address]   TSH     Status: None   Collection Time: 10/26/19  6:30 AM  Result Value Ref Range   TSH 0.845 0.350 - 4.500 uIU/mL    Comment: Performed by a 3rd Generation assay with a functional sensitivity of <=0.01 uIU/mL. Performed at [facility], [address] [place], [place] [address]     Blood Alcohol level:  Lab Results  Component Value Date   [facility] <10 10/24/2019   ETH <5 02/20/2017    Metabolic Disorder Labs:  Lab Results  Component Value Date   HGBA1C 5.2 10/26/2019   MPG 102.54 10/26/2019   No results found for: PROLACTIN Lab Results  Component Value Date   CHOL 152 10/26/2019   TRIG 60  10/26/2019   HDL 44 10/26/2019   CHOLHDL 3.5 10/26/2019   VLDL 12 10/26/2019   LDLCALC 96 10/26/2019    Current Medications: Current Facility-Administered Medications  Medication Dose Route Frequency Provider Last Rate Last Admin  . haloperidol (HALDOL) tablet 5 mg  5 mg Oral Q6H PRN Malvin JohnsFarah, Aleksandar Duve, MD       Or  . haloperidol lactate (HALDOL) injection 10 mg   10 mg Intramuscular Q6H PRN Malvin JohnsFarah, Caidon Foti, MD      . hydrOXYzine (ATARAX/VISTARIL) tablet 25 mg  25 mg Oral TID PRN Malvin JohnsFarah, Ridhima Golberg, MD      . LORazepam (ATIVAN) tablet 2 mg  2 mg Oral Q6H PRN Malvin JohnsFarah, Timithy Arons, MD       Or  . LORazepam (ATIVAN) injection 2 mg  2 mg Intramuscular Q4H PRN Malvin JohnsFarah, Jaylenn Baiza, MD      . OLANZapine zydis (ZYPREXA) disintegrating tablet 20 mg  20 mg Oral QHS Malvin JohnsFarah, Kyi Romanello, MD      . traZODone (DESYREL) tablet 200 mg  200 mg Oral QHS PRN Malvin JohnsFarah, Lerae Langham, MD       PTA Medications: No medications prior to admission.    Musculoskeletal: Strength & Muscle Tone: within normal limits Gait & Station: normal Patient leans: N/A  Psychiatric Specialty Exam: Physical Exam  Nursing note and vitals reviewed. Constitutional: She appears well-developed and well-nourished.  Cardiovascular: Normal rate and regular rhythm.    Review of Systems  Constitutional: Negative.   Eyes: Negative.   Respiratory: Negative.   Cardiovascular: Negative.   Endocrine: Negative.   Genitourinary: Negative.   Neurological: Negative.     Blood pressure (!) 146/128, pulse (!) 128, temperature 98.8 F (37.1 C), temperature source Oral, resp. rate 18, height 5\' 4"  (1.626 m), weight 74.4 kg, SpO2 100 %.Body mass index is 28.15 kg/m.  General Appearance: Casual  Eye Contact:  Absent  Speech: Soft but normal rate  Volume:  Decreased  Mood:  Generally contained  Affect:  Congruent  Thought Process:  Disorganized and Irrelevant  Orientation:  Full (Time, Place, and Person)  Thought Content:  Illogical and Delusions  Suicidal Thoughts:  No  Homicidal Thoughts:  No  Memory:  Immediate;   Fair Recent;   Fair Remote;   Fair  Judgement:  Impaired  Insight:  Lacking  Psychomotor Activity:  Normal  Concentration:  Concentration: Fair and Attention Span: Fair  Recall:  Fis[employer]Fair  Fund of Knowledge:  Poor  Language:  Fair  Akathisia:  Negative  Handed: States she is ambidextrous  AIMS (if indicated):      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 for Improvement  ADL's:  Intact  Cognition:  WNL  Sleep:  Number of Hours: 5.75    Treatment Plan Summary: Daily contact with patient to assess and evaluate symptoms and progress in treatment and Medication management  Observation Level/Precautions:  15 minute checks  Laboratory:  UDS  Psychotherapy: Reality based  Medications: Begin mood stabilizer/Zyprexa therapy  Consultations: None necessary  Discharge Concerns: Longer-term stability  Estimated LOS: 10 days  Other: Axis I schizoaffective   Physician Treatment Plan for Primary Diagnosis: Exacerbation of psychotic disorder related to underlying demyelinating disease and cannabis dependency, probable schizoaffective would be the correct diagnosis although due to the secondary issues Long Term Goal(s): Improvement in symptoms so as ready for discharge  Short Term Goals: Ability to identify changes in lifestyle to reduce recurrence of condition will improve, Ability to verbalize feelings will improve, Ability to disclose and discuss suicidal ideas, Ability  to demonstrate self-control will improve and Ability to identify and develop effective coping behaviors will improve  Physician Treatment Plan for Secondary Diagnosis: Active Problems:   Brief psychotic disorder (HCC)   Schizoaffective disorder, bipolar type (HCC)  Long Term Goal(s): Improvement in symptoms so as ready for discharge  Short Term Goals: Ability to demonstrate self-control will improve, Ability to identify and develop effective coping behaviors will improve, Ability to maintain clinical measurements within normal limits will improve, Compliance with prescribed medications will improve and Ability to identify triggers associated with substance abuse/mental health issues will improve  I certify that inpatient services furnished can reasonably be expected to improve the patient's condition.    Malvin Johns, MD 12/11/202011:47 AM

## 2019-10-27 LAB — PROLACTIN: Prolactin: 35.9 ng/mL — ABNORMAL HIGH (ref 4.8–23.3)

## 2019-10-27 MED ORDER — TRAZODONE HCL 50 MG PO TABS: 50.0000 mg | ORAL_TABLET | Freq: Every evening | ORAL | Status: DC | PRN

## 2019-10-27 NOTE — Progress Notes (Signed)
Patient ID: Emily [name], female   DOB: 10/01/1976, 43 y.o.   MRN: [id number]  [place] NOVEL CORONAVIRUS (COVID-19) DAILY CHECK-OFF SYMPTOMS - answer yes or no to each - every day NO YES  Have you had a fever in the past 24 hours?  . Fever (Temp > 37.80C / 100F) X   Have you had any of these symptoms in the past 24 hours? . New Cough .  Sore Throat  .  Shortness of Breath .  Difficulty Breathing .  Unexplained Body Aches   X   Have you had any one of these symptoms in the past 24 hours not related to allergies?   . Runny Nose .  Nasal Congestion .  Sneezing   X   If you have had runny nose, nasal congestion, sneezing in the past 24 hours, has it worsened?  X   EXPOSURES - check yes or no X   Have you traveled outside the state in the past 14 days?  X   Have you been in contact with someone with a confirmed diagnosis of COVID-19 or PUI in the past 14 days without wearing appropriate PPE?  X   Have you been living in the same home as a person with confirmed diagnosis of COVID-19 or a PUI (household contact)?    X   Have you been diagnosed with COVID-19?    X              What to do next: Answered NO to all: Answered YES to anything:   Proceed with unit schedule Follow the [facility] Inpatient Flowsheet.

## 2019-10-27 NOTE — Plan of Care (Signed)
  Problem: Coping: Goal: Coping ability will improve Outcome: Progressing   Problem: Nutritional: Goal: Ability to achieve adequate nutritional intake will improve Outcome: Progressing   Problem: Role Relationship: Goal: Ability to interact with others will improve Outcome: Progressing

## 2019-10-27 NOTE — Progress Notes (Addendum)
[facility] MD Progress Note  10/27/2019 9:57 AM CALIROSE MCCANCE  MRN:  [id number] Subjective:  "I'm tired."  Ms. Kemnitz found sitting in the dayroom. She remains delusional with disorganized and irrelevant speech. When asked about SI, she states "No, my dad went to war." She reports that she came to the hospital because "I had a nutty fit in front of [place]." She states that she is pregnant with four babies and was worried that they were going to die in the cold. She states that she was pregnant in the past and someone killed the babies, so she is worried about losing her children again. She is calm and cooperative on the unit. She reports good sleep overnight- 7.25 hours recorded. Denies SI/HI/AVH and shows no signs of paranoia or responding to internal stimuli. She complains of sedation this morning from nighttime medications.  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  She has had confusional episodes as well in the past. She was admitted due to a confusional psychosis and delusional beliefs.  Principal Problem: <principal problem not specified> Diagnosis: Active Problems:   Brief psychotic disorder (HCC)   Schizoaffective disorder, bipolar type ([place])  Total Time spent with patient: 15 minutes  Past Psychiatric History: See admission H&P  Past Medical History:  Past Medical History:  Diagnosis Date  . MS (multiple sclerosis) ([place])     Past Surgical History:  Procedure Laterality Date  . ANGIOPLASTY     Family History: History reviewed. No pertinent family history. Family Psychiatric  History: See admission H&P Social History:  Social History   Substance and Sexual Activity  Alcohol Use Yes     Social History   Substance and Sexual Activity  Drug Use Yes  . Types: Marijuana    Social History   Socioeconomic History  . Marital status: Unknown    Spouse name: Not on file  . Number of children: Not  on file  . Years of education: Not on file  . Highest education level: Not on file  Occupational History  . Not on file  Tobacco Use  . Smoking status: Former Research scientist (life sciences)  . Smokeless tobacco: Never Used  Substance and Sexual Activity  . Alcohol use: Yes  . Drug use: Yes    Types: Marijuana  . Sexual activity: Yes    Birth control/protection: None  Other Topics Concern  . Not on file  Social History Narrative  . Not on file   Social Determinants of Health   Financial Resource Strain:   . Difficulty of Paying Living Expenses: Not on file  Food Insecurity:   . Worried About Charity fundraiser in the Last Year: Not on file  . Ran Out of Food in the Last Year: Not on file  Transportation Needs:   . Lack of Transportation (Medical): Not on file  . Lack of Transportation (Non-Medical): Not on file  Physical Activity:   . Days of Exercise per Week: Not on file  . Minutes of Exercise per Session: Not on file  Stress:   . Feeling of Stress : Not on file  Social Connections:   . Frequency of Communication with Friends and Family: Not on file  . Frequency of Social Gatherings with Friends and Family: Not on file  . Attends Religious Services: Not on file  . Active Member of Clubs or Organizations: Not on file  . Attends Archivist Meetings:  Not on file  . Marital Status: Not on file   Additional Social History:                         Sleep: Good  Appetite:  Good  Current Medications: Current Facility-Administered Medications  Medication Dose Route Frequency Provider Last Rate Last Admin  . haloperidol (HALDOL) tablet 5 mg  5 mg Oral Q6H PRN Malvin JohnsFarah, Brian, MD       Or  . haloperidol lactate (HALDOL) injection 10 mg  10 mg Intramuscular Q6H PRN Malvin JohnsFarah, Brian, MD      . hydrOXYzine (ATARAX/VISTARIL) tablet 25 mg  25 mg Oral TID PRN Malvin JohnsFarah, Brian, MD   25 mg at 10/27/19 0834  . LORazepam (ATIVAN) tablet 2 mg  2 mg Oral Q6H PRN Malvin JohnsFarah, Brian, MD       Or  .  LORazepam (ATIVAN) injection 2 mg  2 mg Intramuscular Q4H PRN Malvin JohnsFarah, Brian, MD      . OLANZapine zydis (ZYPREXA) disintegrating tablet 20 mg  20 mg Oral QHS Malvin JohnsFarah, Brian, MD   20 mg at 10/26/19 2058  . traZODone (DESYREL) tablet 200 mg  200 mg Oral QHS PRN Malvin JohnsFarah, Brian, MD   200 mg at 10/26/19 2058    Lab Results:  Results for orders placed or performed during the hospital encounter of 10/25/19 (from the past 48 hour(s))  Hemoglobin A1c     Status: None   Collection Time: 10/26/19  6:30 AM  Result Value Ref Range   Hgb A1c MFr Bld 5.2 4.8 - 5.6 %    Comment: (NOTE) Pre diabetes:          5.7%-6.4% Diabetes:              >6.4% Glycemic control for   <7.0% adults with diabetes    Mean Plasma Glucose 102.54 mg/dL    Comment: Performed at [facility], [address] [place], [place] [address]  Lipid panel     Status: None   Collection Time: 10/26/19  6:30 AM  Result Value Ref Range   Cholesterol 152 0 - 200 mg/dL   Triglycerides 60 <604<150 mg/dL   HDL 44 >54>40 mg/dL   Total CHOL/HDL Ratio 3.5 RATIO   VLDL 12 0 - 40 mg/dL   LDL Cholesterol 96 0 - 99 mg/dL    Comment:        Total Cholesterol/HDL:CHD Risk Coronary Heart Disease Risk Table                     Men   Women  1/2 Average Risk   3.4   3.3  Average Risk       5.0   4.4  2 X Average Risk   9.6   7.1  3 X Average Risk  23.4   11.0        Use the calculated Patient Ratio above and the CHD Risk Table to determine the patient's CHD Risk.        ATP III CLASSIFICATION (LDL):  <100     mg/dL   Optimal  098-119100-129  mg/dL   Near or Above                    Optimal  130-159  mg/dL   Borderline  [id number]  mg/dL   High  >562>190     mg/dL   Very High Performed at [facility], [address] [place], [place] [address]   Prolactin     Status: Abnormal   Collection Time: 10/26/19  6:30 AM  Result Value Ref Range   Prolactin 35.9 (H) 4.8 - 23.3 ng/mL    Comment: (NOTE) Performed At: [facility] [address] [place], [place] [id number] Jolene Schimke MD [id number]   TSH     Status: None   Collection Time: 10/26/19  6:30 AM  Result Value Ref Range   TSH 0.845 0.350 - 4.500 uIU/mL    Comment: Performed by a 3rd Generation assay with a functional sensitivity of <=0.01 uIU/mL. Performed at [facility], [address], [place], [place] [address]     Blood Alcohol level:  Lab Results  Component Value Date   [facility] <10 10/24/2019   ETH <5 02/20/2017    Metabolic Disorder Labs: Lab Results  Component Value Date   HGBA1C 5.2 10/26/2019   MPG 102.54 10/26/2019   Lab Results  Component Value Date   PROLACTIN 35.9 (H) 10/26/2019   Lab Results  Component Value Date   CHOL 152 10/26/2019   TRIG 60 10/26/2019   HDL 44 10/26/2019   CHOLHDL 3.5 10/26/2019   VLDL 12 10/26/2019   LDLCALC 96 10/26/2019    Physical Findings: AIMS:  , ,  ,  ,    CIWA:    COWS:     Musculoskeletal: Strength & Muscle Tone: within normal limits Gait & Station: normal Patient leans: N/A  Psychiatric Specialty Exam: Physical Exam  Nursing note and vitals reviewed. Constitutional: She is oriented to person, place, and time. She appears well-developed and well-nourished.  Cardiovascular: Normal rate.  Respiratory: Effort normal.  Neurological: She is alert and oriented to person, place, and time.    Review of Systems  Constitutional: Negative.   Respiratory: Negative for cough and shortness of breath.   Cardiovascular: Negative for chest pain.  Gastrointestinal: Negative for nausea and vomiting.  Psychiatric/Behavioral: Positive for confusion. Negative for agitation, behavioral problems, self-injury, sleep disturbance and suicidal ideas. The patient is nervous/anxious.     Blood pressure (!) 155/99, pulse (!) 126, temperature 97.8 F (36.6 C), temperature source Oral, resp. rate 18, height 5\' 4"  (1.626 m), weight 74.4 kg, SpO2 100 %.Body mass index is  28.15 kg/m.  General Appearance: Casual  Eye Contact:  Good  Speech:  Pressured  Volume:  Normal  Mood:  Anxious  Affect:  Congruent  Thought Process:  Disorganized and Irrelevant  Orientation:  Full (Time, Place, and Person)  Thought Content:  Delusions  Suicidal Thoughts:  No  Homicidal Thoughts:  No  Memory:  Immediate;   Fair Recent;   Fair Remote;   Fair  Judgement:  Impaired  Insight:  Lacking  Psychomotor Activity:  Normal  Concentration:  Concentration: Fair and Attention Span: Fair  Recall:  of Knowledge:  Fair  Language:  Good  Akathisia:  No  Handed:  Right  AIMS (if indicated):     Assets:  Leisure Time Resilience  ADL's:  Intact  Cognition:  WNL  Sleep:  Number of Hours: 7.25     Treatment Plan Summary: Daily contact with patient to assess and evaluate symptoms and progress in treatment and Medication management   Continue inpatient hospitalization.  Check EKG for tachycardia.  Continue Zyprexa Zydis 20 mg PO QHS for psychosis Decrease trazodone to 50 mg PO QHS PRN insomnia, may repeat x1 PRN Continue Vistaril 25 mg PO TID PRN anxiety  Continue Haldol/Ativan PO or IM PRN agitation  Patient will participate in the therapeutic group milieu.  Discharge disposition in progress.   Aldean BakerJanet E Sykes, NP 10/27/2019, 9:57 AM   Attest to NP note

## 2019-10-27 NOTE — BHH Counselor (Signed)
Clinical Social Work Note  CSW met with patient to attempt Psychosocial Assessment.  She was quite labile and perseverative.  She remained delusional and appeared to be responding to internal stimuli at times.  She was polite and engaged, but unable to respond to questions appropriately.  The PSA was discontinued and she was told we will continue tomorrow when she is less upset.  Selmer Dominion, LCSW 10/27/2019, 4:18 PM

## 2019-10-27 NOTE — Progress Notes (Signed)
B[facility] Group Notes:  (Nursing/MHT/Case Management/Adjunct)  Date:  10/27/2019  Time:  0900  Type of Therapy:  Nurse Education Art Painting Group  Participation Level:  Active  Participation Quality:  Appropriate  Affect:  Appropriate  Cognitive:  Appropriate  Insight:  Appropriate  Engagement in Group:  Engaged  Modes of Intervention:  Activity and Discussion  Summary of Progress/Problems: Discussed ways to cope with mental health disorders. Implemented painting as an example of coping with mental health disorders.  

## 2019-10-28 MED ORDER — TRAZODONE HCL 100 MG PO TABS
100.0000 mg | ORAL_TABLET | Freq: Every evening | ORAL | Status: DC | PRN
  Administered 2019-10-28 – 2019-10-29 (×4): 100 mg via ORAL
  Filled 2019-10-28 (×4): qty 1

## 2019-10-28 MED ORDER — LORAZEPAM 1 MG PO TABS: 1.0000 mg | ORAL_TABLET | Freq: Four times a day (QID) | ORAL | Status: DC | PRN

## 2019-10-28 MED ORDER — LORAZEPAM 2 MG/ML IJ SOLN: 1.0000 mg | INTRAMUSCULAR | Status: DC | PRN

## 2019-10-28 MED ORDER — CARBAMAZEPINE 100 MG PO CHEW
100.0000 mg | CHEWABLE_TABLET | Freq: Three times a day (TID) | ORAL | Status: DC
  Administered 2019-10-28 – 2019-10-30 (×7): 100 mg via ORAL
  Filled 2019-10-28 (×9): qty 1
  Filled 2019-10-28: qty 2
  Filled 2019-10-28 (×4): qty 1

## 2019-10-28 NOTE — Plan of Care (Signed)
Progress note  D: pt found in bed; compliant with medication administration. Pt was viewed anxious and pacing the hall. Pt had verbal altercations with their roommate regarding hygiene practices and general cleanliness. Pt was stated as not sleeping well the night before. Pt was started on new medications and has been resting since. Pt was disorganized during assessment but pleasant. Pt was made a DNA for safety of others and tendencies of intrusiveness. Pt denies si/hi/ah/vh and verbally agrees to approach staff if these become apparent or before harming themself/others while at [facility].  A: Pt provided support and encouragement. Pt given medication per protocol and standing orders. Q59m safety checks implemented and continued.  R: Pt safe on the unit. Will continue to monitor.  Pt progressing in the following metrics  Problem: Activity: Goal: Will verbalize the importance of balancing activity with adequate rest periods Outcome: Progressing   Problem: Coping: Goal: Will verbalize feelings Outcome: Progressing   Problem: Health Behavior/Discharge Planning: Goal: Compliance with prescribed medication regimen will improve Outcome: Progressing

## 2019-10-28 NOTE — Progress Notes (Signed)
   10/27/19 2140  Psych Admission Type (Psych Patients Only)  Admission Status Voluntary  Psychosocial Assessment  Patient Complaints Anxiety  Eye Contact Fair  Facial Expression Anxious  Affect Appropriate to circumstance  Speech Tangential  Interaction Assertive  Motor Activity Slow  Appearance/Hygiene Disheveled  Behavior Characteristics Cooperative  Mood Preoccupied;Pleasant  Thought Process  Coherency Disorganized;Loose associations;Flight of ideas  Content Religiosity;Paranoia  Delusions Religious  Perception WDL  Hallucination None reported or observed  Judgment Poor  Confusion Moderate  Danger to Self  Current suicidal ideation? Denies  Danger to Others  Danger to Others None reported or observed

## 2019-10-28 NOTE — BHH Counselor (Signed)
Clinical Social Work Note  Phone call was made with patient's friend Lenetta Quaker [phone] with patient's consent.  Patient was  homeless for 2 years, had been living with this friend since February 2020 along with patient's boyfriend, and was very stable "except for a drinking problem."  On 09/15/2019 she and boyfriend got out of bed, and he had a massive heart attack and died next to her.  This was very traumatizing. She then took off and has been going from one place to another since then.  Friend and family listed her as a missing person.  Patient has been off her Multiple Sclerosis medicine for the past 2 years.  In April 2020 her disability check and Medicare were cut off, saying her "MS was resolved."  Supposedly her Medicaid was not cut off, however.    In order to get the disability and insurance started back, she needs to be evaluated by an MS specialist.  Friend tried to get her an appointment with a neurologist who said she needed an MS specialist.  Friend can take her back, but needs to make sure she is okay before that happens.  She lives in a secluded area in [place] and could get hurt if she wanders off.  Before all this happened, they had been planning to sign paperwork for this friend to become patient's medical POA.   Selmer Dominion, LCSW 10/28/2019, 3:39 PM

## 2019-10-28 NOTE — BHH Suicide Risk Assessment (Signed)
[facility] INPATIENT:  Family/Significant Other Suicide Prevention Education  Suicide Prevention Education:  Contact Attempts:  father Jason Coop, [phone] , (name of family member/significant other) has been identified by the patient as the family member/significant other with whom the patient will be residing, and identified as the person(s) who will aid the patient in the event of a mental health crisis.  With written consent from the patient, two attempts were made to provide suicide prevention education, prior to and/or following the patient's discharge.  We were unsuccessful in providing suicide prevention education.  A suicide education pamphlet was given to the patient to share with family/significant other.  Date and time of first attempt:  10/28/2019  2:52pm      HIPAA-compliant VM left  Date and time of second attempt:  CSW team to continue attempts  Emily [name] 10/28/2019, 2:52 PM

## 2019-10-28 NOTE — BHH Suicide Risk Assessment (Signed)
[facility] INPATIENT:  Family/Significant Other Suicide Prevention Education  Suicide Prevention Education:  Education Completed; father Jason Coop, [phone] ,  (name of family member/significant other) has been identified by the patient as the family member/significant other with whom the patient will be residing, and identified as the person(s) who will aid the patient in the event of a mental health crisis (suicidal ideations/suicide attempt).  With written consent from the patient, the family member/significant other has been provided the following suicide prevention education, prior to the and/or following the discharge of the patient.  Father states that patient had MS as a child, diagnosed around 9-15yo.  She is supposed to be in ongoing treatment for it.  She was living with her husband in [place] until 2 years ago.  Father knows she did not get along with her husband and left him, but does not know if they divorced.  When she left husband and came to [place], she lived with family only 1-2 days, then went to another family member 2-3 days, went to a shelter for only a few days, then disappeared again.  She has done this several times.    Father is currently in a "state of relief" because she called him a few minutes ago after CSW had found his number and provided it to her.  Her voice was slurred, and she was talking about things that don't make sense, but he said this is not as bad as it sometimes can be, although she is not back to baseline.  When patient is her regular self, she is "no different from anybody else, is funny and busy."     Patient had her first break from reality years ago.  Her most recent episode just like this was 1-1/2 years ago when she thought she was pregnant with twins just as she believes now.  The first time this particular delusion happened was after her boyfriend died of an accidental overdose.     In the past when she has stayed with her  friend Lenetta Quaker [phone] she has been stable for longer periods of time, and father thinks patient may be able to stay with her at discharge.  This lady knows much of her background.    Father lives in [place].  He now has code to call patient and is grateful/relieved.  He asks that we call Lenetta Quaker for the best information about patient.      The suicide prevention education provided includes the following:  Suicide risk factors  Suicide prevention and interventions  National Suicide Hotline telephone number  [facility] assessment telephone number  [facility] Emergency Assistance [place] and/or Residential Mobile Crisis Unit telephone number  Request made of family/significant other to:  Remove weapons (e.g., guns, rifles, knives), all items previously/currently identified as safety concern.    Remove drugs/medications (over-the-counter, prescriptions, illicit drugs), all items previously/currently identified as a safety concern.  The family member/significant other verbalizes understanding of the suicide prevention education information provided.  The family member/significant other agrees to remove the items of safety concern listed above.  Berlin Hun Grossman-Orr 10/28/2019, 3:00 PM

## 2019-10-28 NOTE — BHH Counselor (Signed)
Adult Comprehensive Assessment  Patient ID: Emily [name], female   DOB: Jul 22, 1976, 43 y.o.   MRN: [id number]  Information Source: Information source: Patient  Current Stressors:  Patient states their primary concerns and needs for treatment are:: "Need to have a level head because I know what is coming and it is hard." Patient states their goals for this hospitilization and ongoing recovery are:: "To come out medicated and stronger." Educational / Learning stressors: Denies stressors Employment / Job issues: Denies stressors Family Relationships: Stressful, very.  "I love them all but they don't know how hard it is." Surveyor, quantity / Lack of resources (include bankruptcy): Some stress. Housing / Lack of housing: Denies stressors, "I live with wonderful people." Physical health (include injuries & life threatening diseases): "My physical health is not supposed to be as good as it is."  Later reports that she has Multiple Sclerosis. Social relationships: Denies stressors Substance abuse: Stresses her when she wants to be clean, "I want to smoke legal marijuana."  Sobriety is her biggest goal right now. Bereavement / Loss: #1 loss is mother, because they were good friends.  She died in December 17, 2003.  Brother also died when she was 24yo, and they were very very close.   Grandmother died of cancer, gave patient her wedding ring and now ex-husband won't give it to her, which is very upsetting.  Living/Environment/Situation:  Living Arrangements: Other (Comment)(Homeless) Living conditions (as described by patient or guardian): Good Who else lives in the home?: [employer] "My family" How long has patient lived in current situation?: Since 01/05/2019 What is atmosphere in current home: Supportive  Family History:  Marital status: Divorced Divorced, when?: refers to her ex-husband then says she is not divorced What types of issues is patient dealing with in the relationship?: He has her  grandmother's wedding ring and she wants it back. Does patient have children?: No  Childhood History:  By whom was/is the patient raised?: Both parents Description of patient's relationship with caregiver when they were a child: Good with both.  Then says "When my mother would get mad and throw me as a baby, my father would always catch me." Patient's description of current relationship with people who raised him/her: Mother is deceased. How were you disciplined when you got in trouble as a child/adolescent?: Spanked with a stick or belt Does patient have siblings?: Yes Number of Siblings: 9 Description of patient's current relationship with siblings: Brother she was very close to died when she was 43yo.  Can't see brothers because of their mother.  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 Alcohol/Substance Abuse Treatment Hx: Denies past history Has alcohol/substance abuse ever caused legal problems?: No  Social Support System:   Patient's Community Support System: Good Describe Community Support System: "My family at [employer]"  Leisure/Recreation:   Leisure and Hobbies: Art  Strengths/Needs:   What is the patient's perception of their strengths?: Art Patient states they can use these  personal strengths during their treatment to contribute to their recovery: "Fix it" Patient states these barriers may affect/interfere with their treatment: Out of touch with reality Patient states these barriers may affect their return to the community: None Other important information patient would like considered in planning for their treatment: None  Discharge Plan:   Currently receiving community mental health services: No Patient states concerns and preferences for aftercare planning are: Willing to be referred Patient states they will know when they are safe and ready for discharge when: N/A Does patient have access to transportation?: Yes(Bus or cab) Does patient have financial barriers related to discharge medications?: Yes Patient description of barriers related to discharge medications: No income or insurance Will patient be returning to same living situation after discharge?: [employer], she thinks)  Summary/Recommendations:   Summary and Recommendations (to be completed by the evaluator): Patient is a 43yo female admitted with delusions, responding to internal stimuli, and labile mood.  Primary stressors include bereavement which makes her very tearful. She states she is pregnant with 2 babies and has been smoking/drinking and her baby "loves the party." She reports living at [facility] since February 2020 and says "they're my family."  Patient will benefit from crisis stabilization, medication evaluation, group therapy and psychoeducation, in addition to case management for discharge planning. At discharge it is recommended that Patient adhere to the established discharge plan and continue in treatment.  Emily [name]. 10/28/2019

## 2019-10-28 NOTE — Progress Notes (Addendum)
[facility] MD Progress Note  10/28/2019 1:24 PM Emily [name]  MRN:  [id number] Subjective:  "I'm tired."  Emily [name] found sitting on the floor in her room and reports she is "doing yoga." She continues to present anxious, preoccupied, disorganized, and delusional that she is pregnant with multiple children. Per nursing report she has been intrusive with other patients, requiring PRN medications. She was disruptive overnight, keeping her roommate awake and going through her roommate's belongings. 4.25 hours of sleep recorded. Her speech is pressured. She appears fatigued and is encouraged to rest. She denies SI/HI/AVH.   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 She has had confusional episodes as well in the past. She was admitted due to a confusional psychosis and delusional beliefs.  Principal Problem: <principal problem not specified> Diagnosis: Active Problems:   Brief psychotic disorder (HCC)   Schizoaffective disorder, bipolar type ([place])  Total Time spent with patient: 15 minutes  Past Psychiatric History: See admission H&P  Past Medical History:  Past Medical History:  Diagnosis Date  . MS (multiple sclerosis) ([place])     Past Surgical History:  Procedure Laterality Date  . ANGIOPLASTY     Family History: History reviewed. No pertinent family history. Family Psychiatric  History: See admission H&P Social History:  Social History   Substance and Sexual Activity  Alcohol Use Yes     Social History   Substance and Sexual Activity  Drug Use Yes  . Types: Marijuana    Social History   Socioeconomic History  . Marital status: Unknown    Spouse name: Not on file  . Number of children: Not on file  . Years of education: Not on file  . Highest education level: Not on file  Occupational History  . Not on file  Tobacco Use  . Smoking status: Former Research scientist (life sciences)  . Smokeless tobacco: Never Used   Substance and Sexual Activity  . Alcohol use: Yes  . Drug use: Yes    Types: Marijuana  . Sexual activity: Yes    Birth control/protection: None  Other Topics Concern  . Not on file  Social History Narrative  . Not on file   Social Determinants of Health   Financial Resource Strain:   . Difficulty of Paying Living Expenses: Not on file  Food Insecurity:   . Worried About Charity fundraiser in the Last Year: Not on file  . Ran Out of Food in the Last Year: Not on file  Transportation Needs:   . Lack of Transportation (Medical): Not on file  . Lack of Transportation (Non-Medical): Not on file  Physical Activity:   . Days of Exercise per Week: Not on file  . Minutes of Exercise per Session: Not on file  Stress:   . Feeling of Stress : Not on file  Social Connections:   . Frequency of Communication with Friends and Family: Not on file  . Frequency of Social Gatherings with Friends and Family: Not on file  . Attends Religious Services: Not on file  . Active Member of Clubs or Organizations: Not on file  . Attends Archivist Meetings: Not on file  . Marital Status: Not on file   Additional Social History:                         Sleep: Poor  Appetite:  Good  Current Medications:  Current Facility-Administered Medications  Medication Dose Route Frequency Provider Last Rate Last Admin  . carbamazepine (TEGRETOL) chewable tablet 100 mg  100 mg Oral TID Aldean Baker, NP   100 mg at 10/28/19 1030  . haloperidol (HALDOL) tablet 5 mg  5 mg Oral Q6H PRN Malvin Johns, MD   5 mg at 10/28/19 7510   Or  . haloperidol lactate (HALDOL) injection 10 mg  10 mg Intramuscular Q6H PRN Malvin Johns, MD      . hydrOXYzine (ATARAX/VISTARIL) tablet 25 mg  25 mg Oral TID PRN Malvin Johns, MD   25 mg at 10/28/19 0735  . LORazepam (ATIVAN) tablet 2 mg  2 mg Oral Q6H PRN Malvin Johns, MD   2 mg at 10/28/19 0734   Or  . LORazepam (ATIVAN) injection 2 mg  2 mg Intramuscular  Q4H PRN Malvin Johns, MD      . OLANZapine zydis (ZYPREXA) disintegrating tablet 20 mg  20 mg Oral QHS Malvin Johns, MD   20 mg at 10/27/19 2136  . traZODone (DESYREL) tablet 100 mg  100 mg Oral QHS PRN,MR X 1 Aldean Baker, NP        Lab Results: No results found for this or any previous visit (from the past 48 hour(s)).  Blood Alcohol level:  Lab Results  Component Value Date   ETH <10 10/24/2019   ETH <5 02/20/2017    Metabolic Disorder Labs: Lab Results  Component Value Date   HGBA1C 5.2 10/26/2019   MPG 102.54 10/26/2019   Lab Results  Component Value Date   PROLACTIN 35.9 (H) 10/26/2019   Lab Results  Component Value Date   CHOL 152 10/26/2019   TRIG 60 10/26/2019   HDL 44 10/26/2019   CHOLHDL 3.5 10/26/2019   VLDL 12 10/26/2019   LDLCALC 96 10/26/2019    Physical Findings: AIMS:  , ,  ,  ,    CIWA:    COWS:     Musculoskeletal: Strength & Muscle Tone: within normal limits Gait & Station: normal Patient leans: N/A  Psychiatric Specialty Exam: Physical Exam  Nursing note and vitals reviewed. Constitutional: She is oriented to person, place, and time. She appears well-developed and well-nourished.  Cardiovascular: Normal rate.  Respiratory: Effort normal.  Neurological: She is alert and oriented to person, place, and time.    Review of Systems  Constitutional: Negative.   Respiratory: Negative for cough and shortness of breath.   Psychiatric/Behavioral: Positive for agitation, behavioral problems and sleep disturbance. Negative for dysphoric mood, self-injury and suicidal ideas. The patient is nervous/anxious and is hyperactive.     Blood pressure 123/78, pulse (!) 110, temperature 98.7 F (37.1 C), temperature source Oral, resp. rate 18, height 5\' 4"  (1.626 m), weight 74.4 kg, SpO2 100 %.Body mass index is 28.15 kg/m.  General Appearance: Disheveled  Eye Contact:  Fair  Speech:  Pressured  Volume:  Decreased  Mood:  Anxious  Affect:  Congruent   Thought Process:  Disorganized  Orientation:  Full (Time, Place, and Person)  Thought Content:  Delusions  Suicidal Thoughts:  No  Homicidal Thoughts:  No  Memory:  Immediate;   Poor Recent;   Poor Remote;   Poor  Judgement:  Impaired  Insight:  Lacking  Psychomotor Activity:  Increased  Concentration:  Concentration: Poor and Attention Span: Poor  Recall:  Poor  Fund of Knowledge:  Fair  Language:  Fair  Akathisia:  No  Handed:  Right  AIMS (if indicated):  Assets:  Leisure Time Resilience  ADL's:  Intact  Cognition:  WNL  Sleep:  Number of Hours: 4.25     Treatment Plan Summary: Daily contact with patient to assess and evaluate symptoms and progress in treatment and Medication management   Continue inpatient hospitalization.  Start Tegretol 100 mg PO TID for mood instability Increase trazodone to 100 mg PO QHS PRN insomnia Continue Zyprexa 20 mg PO QHS for psychosis Continue Vistaril 25 mg PO TID PRN anxiety Continue Haldol/Ativan PO or IM PRN agitation  Patient will participate in the therapeutic group milieu.  Discharge disposition in progress.   Aldean Baker, NP 10/28/2019, 1:24 PM   Attest to NP note

## 2019-10-28 NOTE — Progress Notes (Signed)
[facility] Group Notes:  (Nursing/MHT/Case Management/Adjunct)  Date:  10/28/2019  Time:  900 am  Type of Therapy:  Nurse Education [employer] for 2021  Participation Level: Minimal   Participation Quality:  Minimal   Affect: Flat  Cognitive:  Appropriate  Insight: Disorganized   Engagement in Group:  Engaged  Modes of Intervention:  Activity  Summary of Progress/Problems:  Emily [name] L

## 2019-10-29 NOTE — Progress Notes (Signed)
   10/29/19 2200  Psych Admission Type (Psych Patients Only)  Admission Status Voluntary  Psychosocial Assessment  Patient Complaints None  Eye Contact Fair  Facial Expression Anxious;Pensive  Affect Anxious;Preoccupied  Artist;Attention-seeking  Motor Activity Slow  Appearance/Hygiene In scrubs  Behavior Characteristics Anxious  Mood Preoccupied  Thought Process  Coherency Disorganized;Flight of ideas;Loose associations  Content Paranoia  Delusions Paranoid;Religious  Perception WDL  Hallucination None reported or observed  Judgment Poor  Confusion Moderate  Danger to Self  Current suicidal ideation? Denies  Danger to Others  Danger to Others None reported or observed

## 2019-10-29 NOTE — BHH Group Notes (Signed)
LCSW Group Therapy Notes  Type of Therapy and Topic: Group Therapy: Core Beliefs  Participation Level: Active  Description of Group: In this group patients will be encouraged to explore their negative and positive core beliefs about themselves, others, and the world. Each patient will be challenged to identify these beliefs and ways to challenge negative core beliefs. This group will be process-oriented, with patients participating in exploration of their own experiences as well as giving and receiving support and challenge from other group members.  Therapeutic Goals: 1. Patient will identify personal core beliefs, both negative and positive. 2. Patient will identify core beliefs relating to others, both negative and positive. 3. Patient will challenge their negative beliefs about themselves and others. 4. Patient will identify three changes they can make to replace negative core beliefs with positive beliefs.  Summary of Patient Progress  Due to the COVID-19 pandemic, this group has been supplemented with worksheets.  Therapeutic Modalities: Cognitive Behavioral Therapy Solution Focused Therapy Motivational Interviewing  

## 2019-10-29 NOTE — Progress Notes (Signed)
   10/28/19 2110  Psych Admission Type (Psych Patients Only)  Admission Status Voluntary  Psychosocial Assessment  Patient Complaints None  Eye Contact Fair  Facial Expression Anxious;Pensive  Affect Anxious;Preoccupied  Artist;Attention-seeking  Motor Activity Slow  Appearance/Hygiene In scrubs  Behavior Characteristics Appropriate to situation  Mood Preoccupied;Pleasant  Thought Process  Coherency Disorganized;Flight of ideas;Loose associations  Content Paranoia  Delusions Paranoid;Religious  Perception WDL  Hallucination None reported or observed  Judgment Poor  Confusion Moderate  Danger to Self  Current suicidal ideation? Denies  Danger to Others  Danger to Others None reported or observed

## 2019-10-29 NOTE — Progress Notes (Signed)
Recreation Therapy Notes  Date: 12.14.20 Time: 1000 Location: 500 Hall Dayroom  Group Topic: Triggers  Goal Area(s) Addresses:  Patient will effectively identify triggers.  Patient will identify strategies to avoid/reduce exposure to triggers. Patient will identify strategies to deal with triggers head on.  Behavioral Response: Engaged  Intervention:  Worksheet, pencils  Activity: Triggers.  Patients were to identify their biggest triggers, ways to deal with triggers head on and ways to avoid triggers when they can't be avoided.  Education: Communication, Discharge Planning  Education Outcome: Acknowledges understanding/In group clarification offered/Needs additional education.   Clinical Observations/Feedback: Pt identified biggest triggers as disconnect from father, disrespect and being kept inside.  Pt avoids triggers by visiting father more, not talking to disrespectful people and sleeping more.  Pt deals with triggers head on by meditating, ignore them or walking away.  Pt identified some coping skills as exercise, punch a bag, scream on a trail and lift weights.     Victorino Sparrow, LRT/CTRS     Victorino Sparrow A 10/29/2019 11:58 AM

## 2019-10-29 NOTE — Progress Notes (Signed)
[facility] MD Progress Note  10/29/2019 10:34 AM Emily [name]  MRN:  [id number] Subjective:    Patient states she is "doing better" she acknowledges she is not back to her baseline status but cannot be more specific but she denies auditory or visual hallucination she does not express delusional material so again we think she is doing much better as of today and had an uneventful weekend.  No EPS or TD and no thoughts of harming self or others.  Chart review indicates about 6-1/2 hours of sleep, Social work history was very informative patient has been homeless for about 2 years and was with a friend since February of this year.  Principal Problem: <principal problem not specified> Diagnosis: Active Problems:   Brief psychotic disorder (HCC)   Schizoaffective disorder, bipolar type (HCC)  Total Time spent with patient: 20 minutes  Past Psychiatric History: see eval  Past Medical History:  Past Medical History:  Diagnosis Date  . MS (multiple sclerosis) (HCC)     Past Surgical History:  Procedure Laterality Date  . ANGIOPLASTY     Family History: History reviewed. No pertinent family history. Family Psychiatric  History: see eval Social History:  Social History   Substance and Sexual Activity  Alcohol Use Yes     Social History   Substance and Sexual Activity  Drug Use Yes  . Types: Marijuana    Social History   Socioeconomic History  . Marital status: Unknown    Spouse name: Not on file  . Number of children: Not on file  . Years of education: Not on file  . Highest education level: Not on file  Occupational History  . Not on file  Tobacco Use  . Smoking status: Former Games developer  . Smokeless tobacco: Never Used  Substance and Sexual Activity  . Alcohol use: Yes  . Drug use: Yes    Types: Marijuana  . Sexual activity: Yes    Birth control/protection: None  Other Topics Concern  . Not on file  Social History Narrative  . Not on file   Social Determinants of  Health   Financial Resource Strain:   . Difficulty of Paying Living Expenses: Not on file  Food Insecurity:   . Worried About Programme researcher, broadcasting/film/video in the Last Year: Not on file  . Ran Out of Food in the Last Year: Not on file  Transportation Needs:   . Lack of Transportation (Medical): Not on file  . Lack of Transportation (Non-Medical): Not on file  Physical Activity:   . Days of Exercise per Week: Not on file  . Minutes of Exercise per Session: Not on file  Stress:   . Feeling of Stress : Not on file  Social Connections:   . Frequency of Communication with Friends and Family: Not on file  . Frequency of Social Gatherings with Friends and Family: Not on file  . Attends Religious Services: Not on file  . Active Member of Clubs or Organizations: Not on file  . Attends Banker Meetings: Not on file  . Marital Status: Not on file   Additional Social History:                         Sleep: Fair  Appetite:  Fair  Current Medications: Current Facility-Administered Medications  Medication Dose Route Frequency Provider Last Rate Last Admin  . carbamazepine (TEGRETOL) chewable tablet 100 mg  100 mg Oral TID Aldean Baker,  NP   100 mg at 10/29/19 0800  . haloperidol (HALDOL) tablet 5 mg  5 mg Oral Q6H PRN Johnn Hai, MD   5 mg at 10/28/19 4696   Or  . haloperidol lactate (HALDOL) injection 10 mg  10 mg Intramuscular Q6H PRN Johnn Hai, MD      . hydrOXYzine (ATARAX/VISTARIL) tablet 25 mg  25 mg Oral TID PRN Johnn Hai, MD   25 mg at 10/28/19 0735  . LORazepam (ATIVAN) tablet 1 mg  1 mg Oral Q6H PRN Connye Burkitt, NP       Or  . LORazepam (ATIVAN) injection 1 mg  1 mg Intramuscular Q4H PRN Connye Burkitt, NP      . OLANZapine zydis (ZYPREXA) disintegrating tablet 20 mg  20 mg Oral QHS Johnn Hai, MD   20 mg at 10/28/19 2108  . traZODone (DESYREL) tablet 100 mg  100 mg Oral QHS PRN,MR X 1 Connye Burkitt, NP   100 mg at 10/29/19 0136    Lab Results: No  results found for this or any previous visit (from the past 77 hour(s)).  Blood Alcohol level:  Lab Results  Component Value Date   ETH <10 10/24/2019   ETH <5 29/52/8413    Metabolic Disorder Labs: Lab Results  Component Value Date   HGBA1C 5.2 10/26/2019   MPG 102.54 10/26/2019   Lab Results  Component Value Date   PROLACTIN 35.9 (H) 10/26/2019   Lab Results  Component Value Date   CHOL 152 10/26/2019   TRIG 60 10/26/2019   HDL 44 10/26/2019   CHOLHDL 3.5 10/26/2019   VLDL 12 10/26/2019   LDLCALC 96 10/26/2019    Physical Findings: AIMS:  , ,  ,  ,    CIWA:    COWS:     Musculoskeletal: Strength & Muscle Tone: within normal limits Gait & Station: normal Patient leans: N/A  Psychiatric Specialty Exam: Physical Exam  Review of Systems  Blood pressure (!) 135/99, pulse (!) 143, temperature 98.1 F (36.7 C), temperature source Oral, resp. rate 18, height 5\' 4"  (1.626 m), weight 74.4 kg, SpO2 100 %.Body mass index is 28.15 kg/m.  General Appearance: Casual  Eye Contact:  Good  Speech:  Clear and Coherent  Volume:  Normal  Mood:  Euthymic  Affect:  Congruent and Constricted  Thought Process:  Linear and Descriptions of Associations: Circumstantial  Orientation:  Full (Time, Place, and Person)  Thought Content:  Denies hallucinations no delusional material expressed today  Suicidal Thoughts:  No  Homicidal Thoughts:  No  Memory:  Immediate;   Fair Recent;   Fair Remote;   Fair  Judgement:  Fair  Insight:  Fair  Psychomotor Activity:  Normal  Concentration:  Concentration: Fair and Attention Span: Fair  Recall:  [employer] of Knowledge:  Fair  Language:  Fair  Akathisia:  Negative  Handed:  Right  AIMS (if indicated):     Assets:  Physical Health Resilience  ADL's:  Intact  Cognition:  WNL  Sleep:  Number of Hours: 6.25     Treatment Plan Summary: Daily contact with patient to assess and evaluate symptoms and progress in treatment and Medication  management  Continue current precautions no change in meds continue current reality based therapy  Jinnie Onley, MD 10/29/2019, 10:34 AM

## 2019-10-30 ENCOUNTER — Other Ambulatory Visit: Payer: Self-pay

## 2019-10-30 ENCOUNTER — Encounter (HOSPITAL_COMMUNITY): Payer: Self-pay

## 2019-10-30 DIAGNOSIS — W010XXA Fall on same level from slipping, tripping and stumbling without subsequent striking against object, initial encounter: Secondary | ICD-10-CM | POA: Insufficient documentation

## 2019-10-30 DIAGNOSIS — Y9289 Other specified places as the place of occurrence of the external cause: Secondary | ICD-10-CM | POA: Insufficient documentation

## 2019-10-30 DIAGNOSIS — F1210 Cannabis abuse, uncomplicated: Secondary | ICD-10-CM | POA: Insufficient documentation

## 2019-10-30 DIAGNOSIS — Z87891 Personal history of nicotine dependence: Secondary | ICD-10-CM | POA: Insufficient documentation

## 2019-10-30 DIAGNOSIS — Y9301 Activity, walking, marching and hiking: Secondary | ICD-10-CM | POA: Insufficient documentation

## 2019-10-30 DIAGNOSIS — Y998 Other external cause status: Secondary | ICD-10-CM | POA: Insufficient documentation

## 2019-10-30 DIAGNOSIS — S7002XA Contusion of left hip, initial encounter: Secondary | ICD-10-CM | POA: Insufficient documentation

## 2019-10-30 MED ORDER — CARBAMAZEPINE 100 MG PO CHEW
100.0000 mg | CHEWABLE_TABLET | Freq: Every day | ORAL | Status: DC
  Filled 2019-10-30 (×2): qty 21

## 2019-10-30 MED ORDER — OLANZAPINE 10 MG PO TABS
20.0000 mg | ORAL_TABLET | Freq: Every day | ORAL | Status: DC
  Filled 2019-10-30: qty 14

## 2019-10-30 MED ORDER — OLANZAPINE 20 MG PO TABS: 20.0000 mg | ORAL_TABLET | Freq: Every day | ORAL | 1 refills | Status: AC

## 2019-10-30 MED ORDER — CARBAMAZEPINE 100 MG PO CHEW: CHEWABLE_TABLET | ORAL | 2 refills | Status: AC

## 2019-10-30 NOTE — BHH Suicide Risk Assessment (Signed)
[facility] Discharge Suicide Risk Assessment   Principal Problem: Exacerbation of underlying psychotic/bipolar type condition Discharge Diagnoses: Active Problems:   Brief psychotic disorder ([place])   Schizoaffective disorder, bipolar type ([place])   Total Time spent with patient: 45 minutes  Musculoskeletal: Strength & Muscle Tone: within normal limits Gait & Station: normal Patient leans: N/A  Psychiatric Specialty Exam: Review of Systems  Blood pressure (!) 128/94, pulse (!) 128, temperature 97.9 F (36.6 C), temperature source Oral, resp. rate 18, height 5\' 4"  (1.626 m), weight 74.4 kg, SpO2 100 %.Body mass index is 28.15 kg/m.  General Appearance: Casual  Eye Contact::  Good  Speech:  Clear and Coherent409  Volume:  Normal  Mood:  Euthymic  Affect:  Appropriate  Thought Process:  Coherent and Descriptions of Associations: Intact  Orientation:  Full (Time, Place, and Person)  Thought Content:  Logical  Suicidal Thoughts:  No  Homicidal Thoughts:  No  Memory:  Immediate;   Fair Remote;   Fair  Judgement:  Fair  Insight:  Fair  Psychomotor Activity:  Normal  Concentration:  Fair  Recall:  [employer] of Knowledge:Fair  Language: Fair  Akathisia:  Negative  Handed:  Right  AIMS (if indicated):     Assets:  Communication Skills Desire for Improvement  Sleep:  Number of Hours: 5.75  Cognition: WNL  ADL's:  Intact   Mental Status Per Nursing Assessment::   On Admission:  NA  Demographic Factors:  Unemployed  Loss Factors: Decrease in vocational status  Historical Factors: NA  Risk Reduction Factors:   Sense of responsibility to family and Religious beliefs about death  Continued Clinical Symptoms:  Previous Psychiatric Diagnoses and Treatments  Cognitive Features That Contribute To Risk:  Polarized thinking    Suicide Risk:  Minimal: No identifiable suicidal ideation.  Patients presenting with no risk factors but with morbid ruminations; may be classified as  minimal risk based on the severity of the depressive symptoms  Follow-up Information    Primary Care at [facility]. Go on 12/17/2019.   Why: Your primary care appointment is scheduled for 02/01 at 1:50pm. The appointment will be held by phone unless you are otherwise notified. Please ask for a neurology referral. Contact information: [address] [place]  [place], [place] [address] Phone: [phone] Fax: [phone]          Plan Of Care/Follow-up recommendations:  Activity:  full  Shaniya Tashiro, MD 10/30/2019, 9:38 AM

## 2019-10-30 NOTE — ED Triage Notes (Signed)
Pt BIB EMS from [facility] [address]. Pt c/o fall, left hip pain. Per EMS pt able to walk to ambulance. Pt states when she fell she hit her face, but denies hitting head, denies LOC.

## 2019-10-30 NOTE — Plan of Care (Signed)
Pt was able to identify positive coping skills at completion of recreation therapy group session.   Naphtali Riede, LRT/CTRS 

## 2019-10-30 NOTE — Progress Notes (Signed)
  [facility] Adult Case Management Discharge Plan :  Will you be returning to the same living situation after discharge:  Yes,  returning to [employer]. At discharge, do you have transportation home?: Yes,  Lyft. Do you have the ability to pay for your medications: No. Referred to [facility].  Release of information consent forms completed and in the chart; letter on chart.  Patient to Follow up at: Follow-up Information    Primary Care at [facility]. Go on 12/17/2019.   Why: Your primary care appointment is scheduled for 02/01 at 1:50pm. The appointment will be held by phone unless you are otherwise notified. Please ask for a neurology referral. Contact information: [address] [place]  [place], [place] [address] Phone: [phone] Fax: [phone]       [facility] Follow up on 11/06/2019.   Why: Your hospital discharge appointment will be held by phone on 12/22 at 1:00pm. The provider will call you. Please have access to your hospital discharge paperwork.  Contact information: [place] [id number] [phone]           Next level of care provider has access to [place] and Suicide Prevention discussed: Yes,  father and friend.   Has patient been referred to the Quitline?: N/A patient is not a smoker  Patient has been referred for addiction treatment: Yes  Joellen Jersey, [place] 10/30/2019, 10:09 AM

## 2019-10-30 NOTE — Discharge Summary (Signed)
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  She has had confusional episodes as well in the past, and a suspected of being dependent on cannabis due to this positive drug screen and her report of frequent use but she will not elaborate beyond that.  At any rate she was admitted due to a confusional psychosis and delusional believes  Her chief complaint to me is "I am with the child" and then she rambles and makes disjointed statements that are unrelated to the interview questions.  She had presented in April 2018 and a confusional state, and her MRI of the brain showed   ..." innumerable (predominantly) white matter lesions associated with global brain atrophy, are not associated with breakdown of the blood-brain barrier..." And multiple sclerosis was suspected.  When she presented on 12/9 she once again complained of pregnancy and made bizarre and delusional statements, some of her statements were hyper religious that she was "the child of God and the devil is been after her" at 1 point she even called the nurses in and put a key in her mouth threatening to swallow it but was able to eventually take the key out of her mouth while she was in the observation area  Again she is alert oriented to person place situation will not elaborate day date or time makes hyper religious and delusional statements to me, denies wanting to harm self or others, states she "flipped" states  she has a mental illness and "needs help" at the end of the interview so she does have some degree of insight.  She understands what it is to contract for safety and will contract for safety here.  According to the informative telehealth assessment note of 12/9 by Ms. Turner: Emily [name] an 43 y.o.femalep[facility]ing [facility]. It is unclear how patient arrived to ED. Per nursing notes: "Pt states she is pregnant. When asked how far along she is, she states "they cut off my communication, I married two marines today". Pt states that she needs to save her babies life. States that she has been smoking and drinking today and her baby "loves the party".  Upon this counselor's exam patient is pleasant, however is a poor historian due AMS. Patient's speech is rapid and with loose associations. Patient states "I had a mental snap. I got married today. It is the best day of my life." Patient is unable to tell me where she lives and with whom. When asked if she has children she states "I think so." She denies SI/HI. Patient reports she used to take medication for depression. She states that she has a therapist named Carver FilaMikey who is the voice in her head and also who she married today. She states "I'm a druggie." When asked about what substances she uses she states "I don't do drugs." UDS is pending at time of assessment. Patient was seen by psychiatry in 2018 for dissociations but was ultimately psych cleared. She  has no other documented psych history. Patient gives verbal consent for TTS to contact her husband, Carver Fila, but is unable to provide phone number. Patient reports AH of Mikey's voice encouraging her. She reports VH of "signs" no one else sees.   Patient is alert and oriented x 3. She appears disheveled. Her speech is rambling, eye contact is poor, and her thoughts are disorganized. Patient's mood is pleasant and her affect is congruent. Her insight, judgement, and impulse control  are impaired. She does not appear to be responding to internal stimuli during assessment. Patient appears delusional.    Principal Problem: Exacerbation of psychotic disorder Discharge Diagnoses: Active Problems:   Brief psychotic disorder (HCC)   Schizoaffective disorder, bipolar type (HCC)   Past Psychiatric History: Prior treatment as discussed  Past Medical History:  Past Medical History:  Diagnosis Date  . MS (multiple sclerosis) (HCC)     Past Surgical History:  Procedure Laterality Date  . ANGIOPLASTY     Family History: History reviewed. No pertinent family history. Family Psychiatric  History: Denies Social History:  Social History   Substance and Sexual Activity  Alcohol Use Yes     Social History   Substance and Sexual Activity  Drug Use Yes  . Types: Marijuana    Social History   Socioeconomic History  . Marital status: Unknown    Spouse name: Not on file  . Number of children: Not on file  . Years of education: Not on file  . Highest education level: Not on file  Occupational History  . Not on file  Tobacco Use  . Smoking status: Former Games developer  . Smokeless tobacco: Never Used  Substance and Sexual Activity  . Alcohol use: Yes  . Drug use: Yes    Types: Marijuana  . Sexual activity: Yes    Birth control/protection: None  Other Topics Concern  . Not on file  Social History Narrative  . Not on file   Social Determinants of Health   Financial Resource Strain:   . Difficulty of Paying Living Expenses: Not on file  Food Insecurity:   . Worried About Programme researcher, broadcasting/film/video in the Last Year: Not on file  . Ran Out of Food in the Last Year: Not on file  Transportation Needs:   . Lack of Transportation (Medical): Not on file  . Lack of Transportation (Non-Medical): Not on file  Physical Activity:   . Days of Exercise per Week: Not on file  . Minutes of Exercise per Session: Not on file  Stress:   . Feeling of Stress : Not on file  Social  Connections:   . Frequency of Communication with Friends and Family: Not on file  . Frequency of Social Gatherings with Friends and Family: Not on file  . Attends Religious Services: Not on file  . Active Member of Clubs or Organizations: Not on file  . Attends Banker Meetings: Not on file  . Marital Status: Not on file    Hospital Course:    Emily [name] required readmission she is a 43 year old patient with a history of a schizoaffective/bipolar type condition, clearly under treated at the point of presentation on 12/9 when she complained of delusions of pregnancy, and made further bizarre and hyper religious delusional statements, self-care had deteriorated so forth she clearly needed inpatient stabilization see the admission note  Once here she was compliant with medications and displayed no dangerous behaviors and her disorganization of speech and thought did improve and  her delusional believes did in fact resolve over time. Her drug screen showed cannabis she was told to abstain from this is clearly that would be an exacerbating factor but by the date of the 15th she was requesting a discharge home we found her to be alert fully oriented and cooperative knowing day date and time no thoughts of harming self or others no delusional statements made or discerned no hallucinations no involuntary movements stable for release at this point in time    Musculoskeletal: Strength & Muscle Tone: within normal limits Gait & Station: normal Patient leans: N/A  Psychiatric Specialty Exam: Review of Systems  Blood pressure (!) 128/94, pulse (!) 128, temperature 97.9 F (36.6 C), temperature source Oral, resp. rate 18, height 5\' 4"  (1.626 m), weight 74.4 kg, SpO2 100 %.Body mass index is 28.15 kg/m.  General Appearance: Casual  Eye Contact::  Good  Speech:  Clear and Coherent409  Volume:  Normal  Mood:  Euthymic  Affect:  Appropriate  Thought Process:  Coherent and Descriptions of  Associations: Intact  Orientation:  Full (Time, Place, and Person)  Thought Content:  Logical  Suicidal Thoughts:  No  Homicidal Thoughts:  No  Memory:  Immediate;   Fair Remote;   Fair  Judgement:  Fair  Insight:  Fair  Psychomotor Activity:  Normal  Concentration:  Fair  Recall:  Fair  Fund of Knowledge:Fair  Language: Fair  Akathisia:  Negative  Handed:  Right  AIMS (if indicated):     Assets:  Communication Skills Desire for Improvement  Sleep:  Number of Hours: 5.75  Cognition: WNL  ADL's:  Intact         Has this patient used any form of tobacco in the last 30 days? (Cigarettes, Smokeless Tobacco, Cigars, and/or Pipes) Yes, No  Blood Alcohol level:  Lab Results  Component Value Date   ETH <10 10/24/2019   ETH <5 02/20/2017    Metabolic Disorder Labs:  Lab Results  Component Value Date   HGBA1C 5.2 10/26/2019   MPG 102.54 10/26/2019   Lab Results  Component Value Date   PROLACTIN 35.9 (H) 10/26/2019   Lab Results  Component Value Date   CHOL 152 10/26/2019   TRIG 60 10/26/2019   HDL 44 10/26/2019   CHOLHDL 3.5 10/26/2019   VLDL 12 10/26/2019   LDLCALC 96 10/26/2019    See Psychiatric Specialty Exam and Suicide Risk Assessment completed by Attending Physician prior to discharge.  Discharge destination:  Home  Is patient on multiple antipsychotic therapies at discharge:  No   Has Patient had three or more failed trials of antipsychotic monotherapy by history:  No  Recommended Plan for Multiple Antipsychotic Therapies: Additional reason(s) for multiple antispychotic treatment:  n/a   Allergies as of 10/30/2019      Reactions   Penicillins Anaphylaxis   Has patient had a PCN reaction causing immediate rash, facial/tongue/throat swelling, SOB or lightheadedness with hypotension: No Has patient had a PCN reaction causing severe rash involving mucus membranes or skin necrosis: No Has patient had a PCN reaction that required hospitalization:  No Has patient had a PCN reaction occurring within the last 10 years: No If all of the above answers are "NO", then may proceed with Cephalosporin use.      Medication List    TAKE these medications     Indication  carbamazepine 100 MG chewable tablet Commonly known as: TEGRETOL 1 in am 2 at hs  Indication: Depressive Phase of Manic-Depression  OLANZapine 20 MG tablet Commonly known as: ZyPREXA Take 1 tablet (20 mg total) by mouth at bedtime.  Indication: Psychotic Depressive Illness      Follow-up Information    Primary Care at [facility]. Go on 12/17/2019.   Why: Your primary care appointment is scheduled for 02/01 at 1:50pm. The appointment will be held by phone unless you are otherwise notified. Please ask for a neurology referral. Contact information: [address] [place]  [place], [place] [address] Phone: [phone] Fax: [phone]          Signed: Johnn Hai, MD 10/30/2019, 9:40 AM

## 2019-10-30 NOTE — Progress Notes (Signed)
Patient ID: Emily [name], female   DOB: 1976-10-04, 43 y.o.   MRN: [id number] Patient discharged to home self care on her own accord.  Patient acknowledged understanding of all discharge instruction and receipt of personal belongings.

## 2019-10-30 NOTE — Progress Notes (Signed)
Recreation Therapy Notes  Date: 12.15.20 Time: 0945 Location: [address] Dayroom  Group Topic: Wellness  Goal Area(s) Addresses:  Patient will define components of whole wellness. Patient will verbalize benefit of whole wellness.  Behavioral Response: Engaged  Intervention: Music    Activity:  Exercise.  LRT led patients in a series of stretches to get them loosened up.  Patients then led the group in exercises of their choosing.  Patients were to give at least 30 minutes of exercise.  Patients were also encouraged to get water and take breaks when needed.  Patients were also encouraged to pay attention to any aches or pain they may experience and skip those exercises.  Education: Wellness, Dentist.   Education Outcome: Acknowledges education/In group clarification offered/Needs additional education.   Clinical Observations/Feedback: Pt was social and engaged with peers.  Pt mainly led group in stretching exercises.  Pt was moving a little slower and carefully.  Pt was active and completed exercises presented in group even if pt did modified versions of exercises.    Victorino Sparrow, LRT/CTRS     Victorino Sparrow A 10/30/2019 10:41 AM

## 2019-10-30 NOTE — Progress Notes (Signed)
Recreation Therapy Notes  INPATIENT RECREATION TR PLAN  Patient Details Name: Emily [name] MRN: [id number] DOB: 1976-05-20 Today's Date: 10/30/2019  Rec Therapy Plan Is patient appropriate for Therapeutic Recreation?: Yes Treatment times per week: about 3 days Estimated Length of Stay: 5-7 days TR Treatment/Interventions: Group participation (Comment)  Discharge Criteria Pt will be discharged from therapy if:: Discharged Treatment plan/goals/alternatives discussed and agreed upon by:: Patient/family  Discharge Summary Short term goals set: See patient care plan Short term goals met: Complete Progress toward goals comments: Groups attended Which groups?: Self-esteem, Wellness, Other (Comment)(Triggers) Reason goals not met: None Therapeutic equipment acquired: N/A Reason patient discharged from therapy: Discharge from hospital Pt/family agrees with progress & goals achieved: Yes Date patient discharged from therapy: 10/30/19    Victorino Sparrow, LRT/CTRS  Ria Comment, Levone Otten A 10/30/2019, 10:53 AM

## 2019-10-31 ENCOUNTER — Emergency Department (HOSPITAL_COMMUNITY): Payer: Medicaid Other

## 2019-10-31 ENCOUNTER — Emergency Department (HOSPITAL_COMMUNITY)
Admission: EM | Admit: 2019-10-31 | Discharge: 2019-10-31 | Disposition: A | Discharge status: Home / Self Care | Payer: Medicaid Other | Attending: Emergency Medicine | Admitting: Emergency Medicine

## 2019-10-31 DIAGNOSIS — S7002XA Contusion of left hip, initial encounter: Secondary | ICD-10-CM

## 2019-10-31 MED ORDER — IBUPROFEN 800 MG PO TABS
800.0000 mg | ORAL_TABLET | Freq: Once | ORAL | Status: AC
  Administered 2019-10-31: 03:00:00 800 mg via ORAL
  Filled 2019-10-31: qty 1

## 2019-10-31 MED ORDER — NAPROXEN 500 MG PO TABS: 500.0000 mg | ORAL_TABLET | Freq: Two times a day (BID) | ORAL | 0 refills | Status: DC

## 2019-10-31 NOTE — ED Provider Notes (Signed)
[name] Provider Note   CSN: [id number] Arrival date & time: 10/30/19  2034     History Chief Complaint  Patient presents with  . Fall  . Left Hip Pain    Emily [name] is a 43 y.o. female.  HPI     This is a 42 year old female with a history of MS, schizoaffective disorder who presents with left leg pain.  Patient reports that she tripped and fell at the homeless shelter.  She fell onto her left side.  She denies hitting her head or loss of consciousness.  She is not on any anticoagulants.  She is complaining of 6 out of 10 left lateral hip and leg pain.  She has been ambulatory.  She has not taken anything for her pain.  Denies back pain, chest pain, shortness of breath.  Past Medical History:  Diagnosis Date  . MS (multiple sclerosis) [facility])     Patient Active Problem List   Diagnosis Date Noted  . Schizoaffective disorder, bipolar type (HCC)   . 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 Abnormal MRI   . Altered mental status   . 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 Acute encephalopathy 02/21/2017    Past Surgical History:  Procedure Laterality Date  . ANGIOPLASTY       OB History   No obstetric history on file.     History reviewed. No pertinent family history.  Social History   Tobacco Use  . Smoking status: Former Games developer  . Smokeless tobacco: Never Used  Substance Use Topics  . Alcohol use: Yes  . Drug use: Yes    Types: Marijuana    Home Medications Prior to Admission medications   Medication Sig Start Date End Date Taking? Authorizing Provider  carbamazepine (TEGRETOL) 100 MG chewable tablet 1 in am 2 at hs 10/30/19   Malvin Johns, MD  naproxen (NAPROSYN) 500 MG tablet Take 1 tablet (500 mg total) by mouth 2 (two) times daily. 10/31/19   Latoia Eyster, Mayer Masker, MD  OLANZapine (ZYPREXA) 20 MG tablet Take 1 tablet (20 mg total) by mouth at bedtime. 10/30/19   Malvin Johns, MD     Allergies    Penicillins  Review of Systems   Review of Systems  Constitutional: Negative for fever.  Respiratory: Negative for shortness of breath.   Cardiovascular: Negative for chest pain.  Gastrointestinal: Negative for abdominal pain.  Musculoskeletal: Negative for back pain.       Left leg pain  All other systems reviewed and are negative.   Physical Exam Updated Vital Signs BP 117/86   Pulse 85   Temp 97.7 F (36.5 C) (Oral)   Resp 12   Ht 1.702 m (5\' 7" )   Wt 63.5 kg   SpO2 100%   BMI 21.93 kg/m   Physical Exam Vitals and nursing note reviewed.  Constitutional:      Appearance: She is well-developed. She is not ill-appearing.  HENT:     Head: Normocephalic and atraumatic.  Eyes:     Pupils: Pupils are equal, round, and reactive to light.  Cardiovascular:     Rate and Rhythm: Normal rate and regular rhythm.     Heart sounds: Normal heart sounds.  Pulmonary:     Effort: Pulmonary effort is normal. No respiratory distress.     Breath sounds: No wheezing.  Abdominal:     General: Bowel sounds are normal.     Palpations: Abdomen is soft.  Musculoskeletal:     Cervical back: Neck supple.     Comments: No tenderness palpation along the midline of the thoracic or lumbar spine, no step-off or deformity, no deformity noted of the left lower extremity, tenderness to palpation left proximal femur, normal range of motion, 2+ DP pulse  Skin:    General: Skin is warm and dry.  Neurological:     Mental Status: She is alert and oriented to person, place, and time.  Psychiatric:        Mood and Affect: Mood normal.     ED Results / Procedures / Treatments   Labs (all labs ordered are listed, but only abnormal results are displayed) Labs Reviewed - No data to display  EKG None  Radiology No results found.  Procedures Procedures (including critical care time)  Medications Ordered in ED Medications  ibuprofen (ADVIL) tablet 800 mg (800 mg Oral Given  10/31/19 0257)    ED Course  I have reviewed the triage vital signs and the nursing notes.  Pertinent labs & imaging results that were available during my care of the patient were reviewed by me and considered in my medical decision making (see chart for details).    MDM Rules/Calculators/A&P                       Patient presents with left hip pain.  Reports a fall.  She is nontoxic on exam.  Has been ambulatory.  Tender to palpation over the left hip.  She is fairly low risk and no other obvious signs of injury.  Patient given ice and Toradol.  X-ray is negative for acute fracture.  It does suggest contusion.  Recommend supportive care at home with ice and anti-inflammatories.  After history, exam, and medical workup I feel the patient has been appropriately medically screened and is safe for discharge home. Pertinent diagnoses were discussed with the patient. Patient was given return precautions.   Final Clinical Impression(s) / ED Diagnoses Final diagnoses:  Contusion of left hip, initial encounter    Rx / DC Orders ED Discharge Orders         Ordered    naproxen (NAPROSYN) 500 MG tablet  2 times daily     10/31/19 0540           Nero Sawatzky, Barbette Hair, MD 10/31/19 [phone]

## 2019-12-13 ENCOUNTER — Encounter: Payer: Self-pay | Admitting: Internal Medicine

## 2019-12-17 ENCOUNTER — Ambulatory Visit: Payer: Self-pay | Admitting: Internal Medicine

## 2020-04-11 ENCOUNTER — Encounter: Payer: Self-pay | Admitting: *Deleted

## 2020-04-11 ENCOUNTER — Telehealth: Payer: Self-pay | Admitting: *Deleted

## 2020-04-11 NOTE — Congregational Nurse Program (Signed)
  Dept: [phone]   Congregational Nurse Program Note  Date of Encounter: 04/11/2020  Past Medical History: Past Medical History:  Diagnosis Date  . Anxiety   . MDD (major depressive disorder)   . MS (multiple sclerosis) (HCC)     Encounter Details: CNP Questionnaire - 04/11/20 1108      Questionnaire   Patient Status  Not Applicable    Race  Other or two or more races    Location Patient Served At  [facility]    Uninsured  Not Applicable    Food  No food insecurities    Housing/Utilities  No permanent housing    Transportation  No transportation needs    Interpersonal Safety  No, do not feel physically and emotionally safe where you currently live    Medication  Yes, have medication insecurities    Medical Provider  No    Referrals  Other   [facility]   ED Visit Averted  Not Applicable    Life-Saving Intervention Made  Not Applicable      Client came to office tangential in speech. She refers to herself as "psyhco." She reports sleeping outside and having a friend that provides transportation. Referred client to [facility] for housing. She reports her medication has been stolen and was not sure names of medication. She said she last took medication in April. Client verbally denies si or hi. Client reports she will be at [facility] all day and went out to lobby. Checked system for recent visits and medication. Went to lobby to ask client if she had been to any other providers outside of [facility] and she had left [facility]. Asher Torpey W. RN [phone]

## 2020-04-11 NOTE — Congregational Nurse Program (Signed)
  Dept: [phone]   Congregational Nurse Program Note  Date of Encounter: 04/11/2020  Past Medical History: Past Medical History:  Diagnosis Date  . Anxiety   . MDD (major depressive disorder)   . MS (multiple sclerosis) (HCC)     Encounter Details: CNP Questionnaire - 04/11/20 1245      Questionnaire   Patient Status  Not Applicable    Race  Other or two or more races    Location Patient Served At  [facility]    Uninsured  Not Applicable    Food  No food insecurities    Housing/Utilities  No permanent housing    Transportation  Yes, need transportation assistance    Interpersonal Safety  No, do not feel physically and emotionally safe where you currently live    Medication  Yes, have medication insecurities    Medical Provider  No    Referrals  Behavioral/Mental Health Provider    ED Visit Averted  Not Applicable    Life-Saving Intervention Made  Not Applicable      Located client this afternoon and she reports she [facility] medication [facility] [address]. Contacted [facility] and they have no record for client except in [place]. Referred client to [facility] for mental health needs and medication. Gave bus pass for transportation as her friend is not available at this time. Waynetta Sandy RN [phone]

## 2020-04-11 NOTE — Telephone Encounter (Signed)
Called [facility] to inquire about client's medication. They have no record in client in this area.

## 2020-04-14 ENCOUNTER — Encounter (HOSPITAL_COMMUNITY): Payer: Self-pay | Admitting: Emergency Medicine

## 2020-04-14 ENCOUNTER — Emergency Department (HOSPITAL_COMMUNITY)
Admission: EM | Admit: 2020-04-14 | Discharge: 2020-04-17 | Disposition: A | Discharge status: Home / Self Care | Payer: Medicaid Other | Attending: Emergency Medicine | Admitting: Emergency Medicine

## 2020-04-14 DIAGNOSIS — Z87891 Personal history of nicotine dependence: Secondary | ICD-10-CM | POA: Insufficient documentation

## 2020-04-14 DIAGNOSIS — Z20822 Contact with and (suspected) exposure to covid-19: Secondary | ICD-10-CM | POA: Insufficient documentation

## 2020-04-14 DIAGNOSIS — F23 Brief psychotic disorder: Secondary | ICD-10-CM | POA: Insufficient documentation

## 2020-04-14 DIAGNOSIS — Z79899 Other long term (current) drug therapy: Secondary | ICD-10-CM | POA: Insufficient documentation

## 2020-04-14 DIAGNOSIS — G35 Multiple sclerosis: Secondary | ICD-10-CM | POA: Insufficient documentation

## 2020-04-14 DIAGNOSIS — F250 Schizoaffective disorder, bipolar type: Secondary | ICD-10-CM | POA: Insufficient documentation

## 2020-04-14 LAB — COMPREHENSIVE METABOLIC PANEL
ALT: 17 U/L (ref 0–44)
AST: 23 U/L (ref 15–41)
Albumin: 3.8 g/dL (ref 3.5–5.0)
Alkaline Phosphatase: 64 U/L (ref 38–126)
Anion gap: 9 (ref 5–15)
BUN: 5 mg/dL — ABNORMAL LOW (ref 6–20)
CO2: 21 mmol/L — ABNORMAL LOW (ref 22–32)
Calcium: 9.1 mg/dL (ref 8.9–10.3)
Chloride: 109 mmol/L (ref 98–111)
Creatinine, Ser: 0.67 mg/dL (ref 0.44–1.00)
GFR calc Af Amer: >60 mL/min (ref >60)
GFR calc non Af Amer: >60 mL/min (ref >60)
Glucose, Bld: 103 mg/dL — ABNORMAL HIGH (ref 70–99)
Potassium: 3.3 mmol/L — ABNORMAL LOW (ref 3.5–5.1)
Sodium: 139 mmol/L (ref 135–145)
Total Bilirubin: 0.6 mg/dL (ref 0.3–1.2)
Total Protein: 6.3 g/dL — ABNORMAL LOW (ref 6.5–8.1)

## 2020-04-14 LAB — CBC
HCT: 38.9 % (ref 36.0–46.0)
Hemoglobin: 13.4 g/dL (ref 12.0–15.0)
MCH: 34 pg (ref 26.0–34.0)
MCHC: 34.4 g/dL (ref 30.0–36.0)
MCV: 98.7 fL (ref 80.0–100.0)
Platelets: 319 10*3/uL (ref 150–400)
RBC: 3.94 MIL/uL (ref 3.87–5.11)
RDW: 13.8 % (ref 11.5–15.5)
WBC: 11.1 10*3/uL — ABNORMAL HIGH (ref 4.0–10.5)
nRBC: 0 % (ref 0.0–0.2)

## 2020-04-14 LAB — I-STAT BETA HCG BLOOD, ED (MC, WL, AP ONLY): I-stat hCG, quantitative: <5 m[IU]/mL (ref <5)

## 2020-04-14 LAB — RAPID URINE DRUG SCREEN, HOSP PERFORMED
Amphetamines: NOT DETECTED
Barbiturates: NOT DETECTED
Benzodiazepines: NOT DETECTED
Cocaine: POSITIVE — AB
Opiates: NOT DETECTED
Tetrahydrocannabinol: POSITIVE — AB

## 2020-04-14 LAB — ACETAMINOPHEN LEVEL: Acetaminophen (Tylenol), Serum: <10 ug/mL — ABNORMAL LOW (ref 10–30)

## 2020-04-14 LAB — ETHANOL: Alcohol, Ethyl (B): <10 mg/dL (ref <10)

## 2020-04-14 LAB — SARS CORONAVIRUS 2 BY RT PCR (HOSPITAL ORDER, PERFORMED IN ~~LOC~~ HOSPITAL LAB): SARS Coronavirus 2: NEGATIVE

## 2020-04-14 LAB — SALICYLATE LEVEL: Salicylate Lvl: <7 mg/dL — ABNORMAL LOW (ref 7.0–30.0)

## 2020-04-14 MED ORDER — POTASSIUM CHLORIDE CRYS ER 20 MEQ PO TBCR
40.0000 meq | EXTENDED_RELEASE_TABLET | Freq: Once | ORAL | Status: AC
  Administered 2020-04-14: 40 meq via ORAL
  Filled 2020-04-14: qty 2

## 2020-04-14 NOTE — ED Notes (Signed)
Pt moved to hall bed w/ sitter.  RN was informed that pt told TTS she has a knife but gave it to [place]. There is no documentation of this. There is not a knife in her property.  RN had pt re-wanded for safety reasons.  Pt calm and cooperative.

## 2020-04-14 NOTE — ED Triage Notes (Signed)
Patient brought in by [facility], found alone by police outside of someone's home wandering around outside. Patient states she was at a party last nights and had a few drinks. During triage, patient states:  "I do not practice any dark magic."  "They want to control my mind. I come here and it just magically happens."  "It is real? It is real. Because it is. This is real. Is it real? It doesn't matter".  When asked about an abrasion on her left below, patient states "I take the pain if they have the money, you know what I'm saying? From one freak to another, I take the pain because I like it."

## 2020-04-14 NOTE — ED Notes (Signed)
TTS begun 

## 2020-04-14 NOTE — BH Assessment (Addendum)
Tele Assessment Note   Patient Name: Emily [name] MRN: [id number] Referring Physician: Wyn Quaker, PA-C. Location of Patient: [facility], [phone]. Location of Provider: [place] is an 44 y.o. female, who presents voluntary and unaccompanied to [facility]. Pt was a poor historian and answered very few questions, during the assessment. Clinician asked the pt, "what brought you to the hospital?" Pt reported, "I have a very special husband that promised me the world but had to take a lot of pain." Pt reported, "I can't talk about him." Clinician asked the pt if she had recent suicidal thoughts, pt licked her fingers then rubbed her fingers from her wrist down her forearm. Pt reported, she had an arranged marriage. Pt reported, she came to the hospital because she can't stop using illegal drugs. Pt reported, "this twin life is not, I'm a mutant." Pt reported, she had to have surgery because she made a pack with someone. Pt reported, she was suicidal but did not provide any additional information. Pt told clinician she loved her, she was beautiful, and  she has separation anxiety (as the assessment was coming to an end). Pt reported, she had a knife but it was taken by staff.   Per chart, pt's UDS is positive for cocaine. Pt reported, smoking fake marijuana. Per chart, pt's UDS is positive for marijuana. Per chart, pt has a previous inpatient admission to [facility] in December 2020.   Pt presented sitting up in the bed with a blanket over her face but was easily redirected to engage in TTS assessment. Pt was alert, in scrubs with soft, tangential, word salad speech. Pt's mood, affect was labile. Pt's thought process is tangential, flight of ideas. Pt's judgement was impaired. Pt's was oriented x2. Pt's concentration was poor. Clinician asked the pt if she had any supports, pt replied, "only supernatural, regular people aren't into me." Pt reported, not feeling  safe. Pt reported, if inpatient treatment is recommended she will sign-in voluntarily.   Diagnosis: Brief psychotic disorder ([place])                     Schizoaffective disorder, bipolar type ([place])  Past Medical History:  Past Medical History:  Diagnosis Date  . Anxiety   . MDD (major depressive disorder)   . MS (multiple sclerosis) ([place])     Past Surgical History:  Procedure Laterality Date  . ANGIOPLASTY      Family History:  Family History  Family history unknown: Yes    Social History:  reports that she has quit smoking. She has never used smokeless tobacco. She reports current alcohol use. She reports current drug use. Drug: Marijuana.  Additional Social History:  Alcohol / Drug Use Pain Medications: See MAR Prescriptions: See MAR Over the Counter: See MAR History of alcohol / drug use?: Yes Substance #1 Name of Substance 1: Cocaine. 1 - Age of First Use: UTA 1 - Amount (size/oz): Per chart, pt's UDS is positive for cocaine. 1 - Frequency: UTA 1 - Duration: UTA 1 - Last Use / Amount: UTA Substance #2 Name of Substance 2: Marijuana. 2 - Age of First Use: UTA 2 - Amount (size/oz): Pt reported, smoking fake marijuana. Per chart, pt's UDS is postive for marijuana. 2 - Frequency: UTA 2 - Duration: UTA 2 - Last Use / Amount: UTA  CIWA: CIWA-Ar BP: 133/85 Pulse Rate: 84 COWS:    Allergies:  Allergies  Allergen Reactions  .  Penicillins Anaphylaxis    Has patient had a PCN reaction causing immediate rash, facial/tongue/throat swelling, SOB or lightheadedness with hypotension: No Has patient had a PCN reaction causing severe rash involving mucus membranes or skin necrosis: No Has patient had a PCN reaction that required hospitalization: No Has patient had a PCN reaction occurring within the last 10 years: No If all of the above answers are "NO", then may proceed with Cephalosporin use.    Home Medications: (Not in a hospital admission)   OB/GYN Status:  No LMP  recorded.  General Assessment Data Location of Assessment: [facility] ED TTS Assessment: In system Is this a Tele or Face-to-Face Assessment?: Tele Assessment Is this an Initial Assessment or a Re-assessment for this encounter?: Initial Assessment Patient Accompanied by:: N/A Language Other than English: No Living Arrangements: Homeless/Shelter What gender do you identify as?: Female Marital status: Married [place] name: UTA Living Arrangements: Other (Comment)(Homeless shelter. ) Can pt return to current living arrangement?: Yes Admission Status: Voluntary Is patient capable of signing voluntary admission?: Yes Referral Source: Self/Family/Friend Insurance type: Self-pay.      Crisis Care Plan Living Arrangements: Other (Comment)(Homeless shelter. ) Legal Guardian: (Self. ) Name of Psychiatrist: UTA Name of Therapist: UTA  Education Status Is patient currently in school?: (UTA)  Risk to self with the past 6 months Suicidal Ideation: Yes-Currently Present Has patient been a risk to self within the past 6 months prior to admission? : (UTA) Suicidal Intent: (UTA) Has patient had any suicidal intent within the past 6 months prior to admission? : (UTA) Is patient at risk for suicide?: (UTA) Suicidal Plan?: (UTA) Has patient had any suicidal plan within the past 6 months prior to admission? : (UTA) Access to Means: (UTA) What has been your use of drugs/alcohol within the last 12 months?: Cocaine and marijuana.  Previous Attempts/Gestures: (UTA) Other Self Harm Risks: UTA Triggers for Past Attempts: (UTA) Intentional Self Injurious Behavior: (UTA) Family Suicide History: Unable to assess Recent stressful life event(s): (UTA) Persecutory voices/beliefs?: Rich Reining) Depression: (UTA) Substance abuse history and/or treatment for substance abuse?: Yes Suicide prevention information given to non-admitted patients: Not applicable  Risk to Others within the past 6 months Homicidal Ideation:  No Does patient have any lifetime risk of violence toward others beyond the six months prior to admission? : (UTA) Thoughts of Harm to Others: No Current Homicidal Intent: No Current Homicidal Plan: No Access to Homicidal Means: (UTA) Identified Victim: NA History of harm to others?: (UTA) Assessment of Violence: (UTA) Violent Behavior Description: UTA Does patient have access to weapons?: Yes (Comment)(Knife. ) Criminal Charges Pending?: (UTA) Does patient have a court date: (UTA) Is patient on probation?: (UTA)  Psychosis Hallucinations: (UTA) Delusions: Unspecified  Mental Status Report Appearance/Hygiene: In scrubs Eye Contact: Fair Motor Activity: Unremarkable Speech: Soft, Tangential, Word salad Level of Consciousness: Alert Mood: Labile Affect: Labile Anxiety Level: Moderate Thought Processes: Tangential, Flight of Ideas Judgement: Impaired Orientation: Person, Place Obsessive Compulsive Thoughts/Behaviors: Minimal  Cognitive Functioning Concentration: Poor Memory: Recent Impaired Is patient IDD: No Insight: Poor Impulse Control: Unable to Assess Appetite: (UTA) Sleep: Unable to Assess Vegetative Symptoms: Unable to Assess  ADLScreening [facility] Assessment Services) Patient's cognitive ability adequate to safely complete daily activities?: No Patient able to express need for assistance with ADLs?: Yes Independently performs ADLs?: Yes (appropriate for developmental age)  Prior Inpatient Therapy Prior Inpatient Therapy: Yes Prior Therapy Dates: December 2020.  Prior Therapy Facilty/Provider(s): [facility]  Prior Outpatient Therapy Prior Outpatient Therapy: (UTA)  ADL Screening (condition at time of admission) Patient's cognitive ability adequate to safely complete daily activities?: No Is the patient deaf or have difficulty hearing?: (UTA) Does the patient have difficulty seeing, even when wearing glasses/contacts?: (UTA) Does the patient have difficulty  concentrating, remembering, or making decisions?: Yes Patient able to express need for assistance with ADLs?: Yes Does the patient have difficulty dressing or bathing?: No Independently performs ADLs?: Yes (appropriate for developmental age) Does the patient have difficulty walking or climbing stairs?: (UTA) Weakness of Legs: (UTA) Weakness of Arms/Hands: (UTA)  Home Assistive Devices/Equipment Home Assistive Devices/Equipment: (UTA)    Abuse/Neglect Assessment (Assessment to be complete while patient is alone) Abuse/Neglect Assessment Can Be Completed: Unable to assess, patient is non-responsive or altered mental status     Advance Directives (For Healthcare) Does Patient Have a Medical Advance Directive?: Unable to assess, patient is non-responsive or altered mental status          Disposition: Nira Conn, NP recommends inpatient treatment. Per Hassie Bruce, RN no appropriate beds available. TTS to seek placement. Disposition discussed with Lanora Manis, [place] and Victorino Dike, RN.     This service was provided via telemedicine using a 2-way, interactive audio and video technology.  Names of all persons participating in this telemedicine service and their role in this encounter. Name: Emily [name]. Role: Patient.  Name: Redmond Pulling, MS, [facility], [facility]. Role: Counselor.          Redmond Pulling 04/14/2020 8:41 PM    Redmond Pulling, MS, [facility], [facility] Triage Specialist [phone]

## 2020-04-14 NOTE — ED Notes (Signed)
Sitter informed RN that pt has "an accident" when she was sleeping.  RN called purple zone to check to see if the shower was empty/clean, pt will go shower in 10 minutes

## 2020-04-14 NOTE — ED Provider Notes (Signed)
[facility] EMERGENCY DEPARTMENT Provider Note   CSN: [id number] Arrival date & time: 04/14/20  1202     History No chief complaint on file.   Emily [name] is a 44 y.o. female with a past medical history of schizoaffective disorder, brief psychotic disorder, who presents today for evaluation of psychiatric condition. History obtained from patient, chart review.  Patient reportedly said she was at a party last night and had a few drinks.  Triage note reports that she was found alone by police outside someone's house wandering around outside.    When asked patient what brings her in today she begins speaking very quietly and rapidly, she states "you know I am a chimera, don't you?" She speaks about her babies.  When asked her how many children she has she tells me that she has all of the children from all over.  I asked her how old they are and she tells me they are all ages.    Patient has a history of MS.  She has required admission to [facility] in December 2020 for confusional psychosis and delusional behaviors.   Level 5 caveat for psychiatric condition  HPI     Past Medical History:  Diagnosis Date  . Anxiety   . MDD (major depressive disorder)   . MS ([facility] sclerosis) [facility])     Patient Active Problem List   Diagnosis Date Noted  . Schizoaffective disorder, bipolar type (HCC)   . 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 Abnormal MRI   . Altered mental status   . 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 Acute encephalopathy 02/21/2017    Past Surgical History:  Procedure Laterality Date  . ANGIOPLASTY       OB History   No obstetric history on file.     Family History  Family history unknown: Yes    Social History   Tobacco Use  . Smoking status: Former Games developer  . Smokeless tobacco: Never Used  Substance Use Topics  . Alcohol use: Yes  . Drug use: Yes    Types: Marijuana    Home Medications Prior to  Admission medications   Medication Sig Start Date End Date Taking? Authorizing Provider  carbamazepine (TEGRETOL) 100 MG chewable tablet 1 in am 2 at hs 10/30/19   Malvin Johns, MD  OLANZapine (ZYPREXA) 20 MG tablet Take 1 tablet (20 mg total) by mouth at bedtime. 10/30/19   Malvin Johns, MD    Allergies    Penicillins  Review of Systems   Review of Systems  Unable to perform ROS: Psychiatric disorder    Physical Exam Updated Vital Signs BP (!) 141/75 (BP Location: Left Arm)   Pulse 85   Temp 98.4 F (36.9 C) (Oral)   Resp 18   SpO2 100%   Physical Exam Vitals and nursing note reviewed.  Constitutional:      General: She is not in acute distress.    Appearance: She is well-developed. She is not diaphoretic.  HENT:     Head: Normocephalic.  Eyes:     General: No scleral icterus.       Right eye: No discharge.        Left eye: No discharge.     Conjunctiva/sclera: Conjunctivae normal.  Cardiovascular:     Rate and Rhythm: Normal rate and regular rhythm.  Pulmonary:     Effort: Pulmonary effort is normal. No respiratory distress.     Breath sounds: No stridor.  Abdominal:     General: There is no distension.  Musculoskeletal:        General: No deformity.     Cervical back: Normal range of motion.  Skin:    General: Skin is warm and dry.  Neurological:     Mental Status: She is alert.     Motor: No abnormal muscle tone.     Comments: Patient is awake and alert.  Speech is not slurred.  Moves all 4 extremities spontaneously.   Psychiatric:        Attention and Perception: She is inattentive.        Mood and Affect: Mood is elated. Affect is tearful.        Speech: Speech is rapid and pressured and tangential.        Behavior: Behavior is hyperactive.        Thought Content: Thought content is paranoid and delusional.        Cognition and Memory: Cognition is impaired.        Judgment: Judgment is impulsive.     Comments: Patient appears to be responding to  internal stimuli.  Her thoughts are tangential.  She is unable to answer questions appropriately.     ED Results / Procedures / Treatments   Labs (all labs ordered are listed, but only abnormal results are displayed) Labs Reviewed  COMPREHENSIVE METABOLIC PANEL - Abnormal; Notable for the following components:      Result Value   Potassium 3.3 (*)    CO2 21 (*)    Glucose, Bld 103 (*)    BUN 5 (*)    Total Protein 6.3 (*)    All other components within normal limits  SALICYLATE LEVEL - Abnormal; Notable for the following components:   Salicylate Lvl <7.0 (*)    All other components within normal limits  ACETAMINOPHEN LEVEL - Abnormal; Notable for the following components:   Acetaminophen (Tylenol), Serum <10 (*)    All other components within normal limits  CBC - Abnormal; Notable for the following components:   WBC 11.1 (*)    All other components within normal limits  RAPID URINE DRUG SCREEN, HOSP PERFORMED - Abnormal; Notable for the following components:   Cocaine POSITIVE (*)    Tetrahydrocannabinol POSITIVE (*)    All other components within normal limits  SARS CORONAVIRUS 2 BY RT PCR (HOSPITAL ORDER, PERFORMED IN [facility])  ETHANOL  I-STAT BETA HCG BLOOD, ED ([facility], [facility], [facility] ONLY)    EKG None  Radiology No results found.  Procedures Procedures (including critical care time)  Medications Ordered in ED Medications  potassium chloride SA (KLOR-CON) CR tablet 40 mEq (has no administration in time range)    ED Course  I have reviewed the triage vital signs and the nursing notes.  Pertinent labs & imaging results that were available during my care of the patient were reviewed by me and considered in my medical decision making (see chart for details).    MDM Rules/Calculators/A&P                     Patient is a 44 year old woman who presents today for psychiatric evaluation.  On my exam she appears to be responding to internal stimuli.  She is  tearful with rapid, pressured, and tangential speech.  She makes vague statements about being a mother to all of the children, having her children taken away, practicing magic and other bizarre statements.  Is obtained and  reviewed, she is mildly hypokalemic with potassium of 3.3 however salicylate level acetaminophen and ethanol are undetected.  UDS is positive for cocaine and cannabis.  P.o. potassium both placement ordered.  Pregnancy test is negative.  On chart review it appears that her prior presentations for psychiatric admission have been similar.  CT head is considered as she has history of MS, however she appears neurologically intact aside from her psychosis, and with previous [facility] diagnosed psychosis no indication for CT head at this time.  Patient is currently voluntary, however if she attempts to leave based on her current presentation I would IVC as I feel like she is unable to be safe.  Patient medically clear for psychiatric disposition.   Final Clinical Impression(s) / ED Diagnoses Final diagnoses:  Schizoaffective disorder, bipolar type [facility])    Rx / DC Orders ED Discharge Orders    None       Ollen Gross 04/14/20 2035    Varney Biles, MD 04/21/20 [phone]

## 2020-04-15 ENCOUNTER — Other Ambulatory Visit: Payer: Self-pay

## 2020-04-15 MED ORDER — OLANZAPINE 5 MG PO TABS
5.0000 mg | ORAL_TABLET | Freq: Every day | ORAL | Status: DC
  Administered 2020-04-16 (×2): 5 mg via ORAL
  Filled 2020-04-15 (×4): qty 1

## 2020-04-15 MED ORDER — OLANZAPINE 5 MG PO TBDP
5.0000 mg | ORAL_TABLET | Freq: Once | ORAL | Status: AC
  Administered 2020-04-15: 5 mg via ORAL
  Filled 2020-04-15 (×2): qty 1

## 2020-04-15 MED ORDER — CARBAMAZEPINE 100 MG PO CHEW
100.0000 mg | CHEWABLE_TABLET | Freq: Two times a day (BID) | ORAL | Status: DC
  Administered 2020-04-15 – 2020-04-16 (×4): 100 mg via ORAL
  Filled 2020-04-15 (×7): qty 1

## 2020-04-15 NOTE — Progress Notes (Signed)
Pt meets inpatient criteria per Caryn Bee, NP. Referral information has been sent to the following hospitals for review:  [facility] Details [facility] Details [facility] Details [facility] Details [facility] Details  [facility] Details [facility] Details  [facility] Details [facility]  Disposition will continue to assist with inpatient placement needs.   Wells Guiles, LCSW, LCAS Disposition CSW [facility]/TTS [phone]

## 2020-04-15 NOTE — Consult Note (Signed)
  Patient seen and case discussed with treatment team. Patient continues to meet inpatient criteria.Will resume home medication. She is given a OTO of zydis 5mg  po in a single dose, then will resume zyprexa 5mg  po qhs. Will also resume her tegretol 100mg  po BID chewable. Will reassess patient daily.

## 2020-04-15 NOTE — ED Notes (Signed)
Lunch Tray Ordered 

## 2020-04-15 NOTE — ED Notes (Signed)
TTS complete 

## 2020-04-15 NOTE — ED Notes (Signed)
Pt urinated on self and bed. Pt will folow comands but is talking in a raound about manner, similar to flight of ideas

## 2020-04-15 NOTE — ED Notes (Signed)
Breakfast tray ordered 

## 2020-04-15 NOTE — ED Provider Notes (Signed)
Emergency Medicine Observation Re-evaluation Note  Emily [name] is a 44 y.o. female, seen on rounds today.  Pt initially presented to the ED for complaints of No chief complaint on file. Currently, the patient is resting comfortably. She is still delusional and manic-appearing.   Physical Exam  BP 117/77 (BP Location: Right Arm)   Pulse 81   Temp 98.1 F (36.7 C) (Oral)   Resp 18   SpO2 99%  Physical Exam Vitals and nursing note reviewed. Exam conducted with a chaperone present.  Constitutional:      Appearance: Normal appearance.  HENT:     Head: Normocephalic and atraumatic.  Eyes:     General: No scleral icterus.    Conjunctiva/sclera: Conjunctivae normal.  Pulmonary:     Effort: Pulmonary effort is normal.  Skin:    General: Skin is dry.  Neurological:     Mental Status: She is alert.     GCS: GCS eye subscore is 4. GCS verbal subscore is 5. GCS motor subscore is 6.  Psychiatric:        Mood and Affect: Mood normal.        Behavior: Behavior normal.        Thought Content: Thought content normal.     ED Course / MDM  EKG:EKG Interpretation  Date/Time:  Monday Apr 14 2020 18:43:25 EDT Ventricular Rate:  85 PR Interval:    QRS Duration: 83 QT Interval:  360 QTC Calculation: 428 R Axis:   51 Text Interpretation: Sinus rhythm Short PR interval Minor motion artifact in I, II, III No sig change from Dec 2020 ecg Confirmed by Alvester Chou [phone]) on 04/15/2020 9:06:23 AM    I have reviewed the labs performed to date as well as medications administered while in observation.  Recent changes in the last 24 hours include peeing her pants last evening. Plan  Current plan is for admission to [facility].  TTS consult was performed yesterday at 8 PM.  NP Allyson Sabal recommended inpatient treatment for her psychotic disorder.  On me examination, patient continues to be acting manic with flight of ideas.  She states that she is not a runner because she tried that before but then died in  the streets.  She is very pleasant however and is happy with the care that she is receiving here.  Patient is not under full IVC at this time.   Lorelee New, PA-C 04/15/20 1207    Sabas Sous, MD 04/21/20 1300

## 2020-04-16 MED ORDER — IBUPROFEN 400 MG PO TABS
400.0000 mg | ORAL_TABLET | Freq: Four times a day (QID) | ORAL | Status: DC | PRN
  Administered 2020-04-16: 400 mg via ORAL
  Filled 2020-04-16: qty 1

## 2020-04-16 NOTE — ED Notes (Signed)
Pt up wandering around room.  Assisted to BR without concerns noted.  Remains calm and able to redirect.  Warm blankets and clean sheets applied to bed.

## 2020-04-16 NOTE — BH Assessment (Signed)
Re-faxed referrals to the following facilities for consideration of a bed:  [facility] Details    [facility] Details    [facility] Details    [facility] Details    [facility] Details    [facility] Details    [facility] Details    [facility] Details    [facility] Details    [facility] Details    [facility] Details    [facility] Details    [facility] Details    [facility] Details    [facility] Details    [facility] Details    [facility] Details    [facility] Details    [facility]

## 2020-04-16 NOTE — BH Assessment (Signed)
Per Delaney Meigs, patient accepted to [facility] for admission 04/17/2020 (anytime after 9am). The accepting provider is Dr. Estill Cotta. Nurse report [phone].

## 2020-04-16 NOTE — ED Notes (Signed)
Lunch Tray Ordered @ 1044.  

## 2020-04-16 NOTE — BH Assessment (Addendum)
Per Delaney Meigs, patient accepted to [facility] for admission 04/17/2020 (anytime after 9am). The accepting provider is Dr. Estill Cotta. Nurse report [phone]. Unable to reach patient's nurse. Called staff at [facility] & left a message to have Elon Jester call this clinician to provide updates regarding patient's disposition.

## 2020-04-16 NOTE — ED Notes (Signed)
EDP Effie Shy made aware of pts recent swelling to left pinkie finger. Pt has good capillary refill along with good movement of finger. PRN pain medication ordered for pain score of 3/10.

## 2020-04-16 NOTE — ED Notes (Signed)
Intermittently up OOB, easily redirected back to room.  Looking though storage container hanging on door.   Intermittently talks to herself without elevation in mood.

## 2020-04-16 NOTE — ED Notes (Signed)
Pt provided with feminine hygiene products as requested.

## 2020-04-16 NOTE — ED Provider Notes (Signed)
Blood pressure 122/78, pulse 89, temperature 98.3 F (36.8 C), temperature source Oral, resp. rate 15, SpO2 99 %.  In short, Emily [name] is a 44 y.o. female with a chief complaint of No chief complaint on file. Marland Kitchen  Refer to the original H&P for additional details.  06:45 PM  Patient meets inpatient criteria and will be transported to [facility] in the morning.  She requires IVC for transport which seems appropriate.  I have filed the paperwork.    Maia Plan, MD 04/16/20 [name]

## 2020-04-16 NOTE — ED Notes (Signed)
Per [facility] - Pt has been accepted to [facility] for admission 04-17-2020, anytime after 9 AM.   Accepting provider- Dr. Estill Cotta Nurse Report [phone]  Pt must be IVC'd to have transport to the facility by [facility].   [facility] cannot take her d/t mileage limitations.

## 2020-04-16 NOTE — ED Notes (Signed)
This RN called the staffing Office to request a sitter for this pt; this RN was made aware that there is not one available at this time but one will be sent if one becomes available.

## 2020-04-16 NOTE — ED Notes (Signed)
This RN called for transport for this pt and left a message for the Sergeant.

## 2020-04-16 NOTE — ED Provider Notes (Signed)
Emergency Medicine Observation Re-evaluation Note  Emily [name] is a 44 y.o. female, seen on rounds today.  Pt initially presented to the ED for psychiatric eval.  Currently, the patient is resting comfrotably.  Physical Exam  BP 119/70 (BP Location: Right Arm)   Pulse 80   Temp 98.2 F (36.8 C) (Oral)   Resp 20   SpO2 100%  Physical Exam  ED Course / MDM  EKG:EKG Interpretation  Date/Time:  Monday Apr 14 2020 18:43:25 EDT Ventricular Rate:  85 PR Interval:    QRS Duration: 83 QT Interval:  360 QTC Calculation: 428 R Axis:   51 Text Interpretation: Sinus rhythm Short PR interval Minor motion artifact in I, II, III No sig change from Dec 2020 ecg Confirmed by Alvester Chou [phone]) on 04/15/2020 9:06:23 AM    I have reviewed the labs performed to date as well as medications administered while in observation.  Recent changes in the last 24 hours include resuming Zyprexa PO and tegretol per psych. Pt fell out of bed last pm, but did not appear to have any injuries. She has ambulated since without difficulty.  Plan  Current plan is for inpatient tx, pending placement. Patient is not under full IVC at this time.   Alveria Apley, PA-C 04/16/20 1312    Gerhard Munch, MD 04/19/20 1620

## 2020-04-16 NOTE — ED Notes (Signed)
INPT  Breakfast ordered

## 2020-04-16 NOTE — ED Notes (Signed)
Pt was sleeping in bed when upon awaking and attempt to get out of bed to use the bathroom, this RN observed this pt fall out of the stretcher.   Pt fell on bottom and arose quickly off the floor. Pt has no complaints of any pain. Pt ambulated well afterwards as well.   EDP Ward made aware of this occurrence and a full set of VS was obtained and pt was ambulated per EDP request and pt ambulated well with no gait/mobility issues.   Pt is returned to bed and is now resting after using the BR. This RN will continue to monitor.

## 2020-04-17 NOTE — ED Notes (Signed)
Pt received breakfast tray 

## 2020-04-17 NOTE — ED Notes (Signed)
Pt discharged 04/17/20 at 0932. Pt chart locked due to activity after d/c time. Pt to be d/c from computer at 1143, left facility at [facility]

## 2020-04-17 NOTE — ED Notes (Signed)
Breakfast ordered 

## 2020-04-28 ENCOUNTER — Encounter: Payer: Self-pay | Admitting: *Deleted

## 2020-04-28 NOTE — Congregational Nurse Program (Signed)
  Dept: [phone]   Congregational Nurse Program Note  Date of Encounter: 04/28/2020  Past Medical History: Past Medical History:  Diagnosis Date  . Anxiety   . MDD (major depressive disorder)   . MS (multiple sclerosis) (HCC)     Encounter Details:  CNP Questionnaire - 04/28/20 1118      Questionnaire   Patient Status Not Applicable    Race Other or two or more races    Location Patient Served At [facility]    Food No food insecurities    Housing/Utilities No permanent housing    Transportation Yes, need transportation assistance    Interpersonal Safety No, do not feel physically and emotionally safe where you currently live    Medication Yes, have medication insecurities   does not appear pt taking medications consistently   Medical Provider No    Referrals Behavioral/Mental Health Provider    ED Visit Averted Not Applicable    Life-Saving Intervention Made Not Applicable          Client came to [facility] and is requesting help with housing. She was seen sitting outside on bench in front of [employer]. Client was referred to [facility] for housing. Asked client about recent [facility] visit and she could not recall. Client mentioned her children in conversation and when asked if she had children she pointed to her stomach and said her babies. Client denies any physical complaints. She denies si and hi thoughts. Encouraged client to follow up with [facility] and offered [facility] services.

## 2020-04-30 ENCOUNTER — Ambulatory Visit (INDEPENDENT_AMBULATORY_CARE_PROVIDER_SITE_OTHER): Payer: No Payment, Other | Admitting: Clinical

## 2020-04-30 ENCOUNTER — Encounter: Payer: Self-pay | Admitting: *Deleted

## 2020-04-30 ENCOUNTER — Telehealth: Payer: Self-pay | Admitting: *Deleted

## 2020-04-30 ENCOUNTER — Other Ambulatory Visit: Payer: Self-pay

## 2020-04-30 DIAGNOSIS — F259 Schizoaffective disorder, unspecified: Secondary | ICD-10-CM | POA: Diagnosis not present
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
## 2020-04-30 NOTE — Telephone Encounter (Signed)
Scheduled ride to [facility] today for 3:00 appointment and return to [facility].

## 2020-04-30 NOTE — Telephone Encounter (Signed)
Appointment made for 3:00 today with Paige.

## 2020-04-30 NOTE — Congregational Nurse Program (Signed)
°  Dept: [phone]   Congregational Nurse Program Note  Date of Encounter: 04/30/2020  Past Medical History: Past Medical History:  Diagnosis Date   Anxiety    MDD (major depressive disorder)    MS (multiple sclerosis) (HCC)     Encounter Details:  CNP Questionnaire - 04/30/20 1049      Questionnaire   Patient Status Not Applicable    Race Other or two or more races    Location Patient Served At [employer] Not Applicable    Uninsured Uninsured (NEW 1x/quarter)    Food Yes, have food insecurities    Housing/Utilities No permanent housing    Transportation Yes, need transportation assistance    Interpersonal Safety No, do not feel physically and emotionally safe where you currently live    Medication Yes, have medication insecurities    Medical Provider No    Referrals Behavioral/Mental Health Provider;Other;Primary Care Provider/Clinic   [facility]   ED Visit Averted Not Applicable    Life-Saving Intervention Made Not Applicable          Client came in with husband of five years. Client reports she slept outside in sleeping bag last night and needing help with housing and SS benefits. [facility] referral. Client requesting mental health services. She denies si and has thoughts "people want to touch me and look at me." She wanted to hurt someone "punch them in the face." Pt reports she has been off of her psychiatric medication. Pt is being referred to [facility]. Appointment made with Paige at 3:00 today. Transportation will be provided. Pt reports she may be pregnant. Client advised to schedule [facility] NP appointment at front desk. Food resource information given to pt and her husband.

## 2020-05-01 ENCOUNTER — Other Ambulatory Visit: Payer: Self-pay

## 2020-05-01 ENCOUNTER — Emergency Department (HOSPITAL_COMMUNITY)
Admission: EM | Admit: 2020-05-01 | Discharge: 2020-05-01 | Disposition: A | Discharge status: Home / Self Care | Payer: Medicaid Other | Attending: Emergency Medicine | Admitting: Emergency Medicine

## 2020-05-01 ENCOUNTER — Encounter (HOSPITAL_COMMUNITY): Payer: Self-pay | Admitting: Emergency Medicine

## 2020-05-01 DIAGNOSIS — Z79899 Other long term (current) drug therapy: Secondary | ICD-10-CM | POA: Insufficient documentation

## 2020-05-01 DIAGNOSIS — Z91040 Latex allergy status: Secondary | ICD-10-CM | POA: Insufficient documentation

## 2020-05-01 DIAGNOSIS — Z87891 Personal history of nicotine dependence: Secondary | ICD-10-CM | POA: Insufficient documentation

## 2020-05-01 DIAGNOSIS — N390 Urinary tract infection, site not specified: Secondary | ICD-10-CM | POA: Insufficient documentation

## 2020-05-01 DIAGNOSIS — Z5900 Homelessness unspecified: Secondary | ICD-10-CM

## 2020-05-01 LAB — URINALYSIS, ROUTINE W REFLEX MICROSCOPIC
Bilirubin Urine: NEGATIVE
Glucose, UA: NEGATIVE mg/dL
Ketones, ur: NEGATIVE mg/dL
Nitrite: NEGATIVE
Protein, ur: 30 mg/dL — AB
Specific Gravity, Urine: 1.017 (ref 1.005–1.030)
WBC, UA: >50 WBC/hpf — ABNORMAL HIGH (ref 0–5)
pH: 5 (ref 5.0–8.0)

## 2020-05-01 LAB — I-STAT BETA HCG BLOOD, ED (MC, WL, AP ONLY): I-stat hCG, quantitative: <5 m[IU]/mL (ref <5)

## 2020-05-01 LAB — BASIC METABOLIC PANEL
Anion gap: 7 (ref 5–15)
BUN: 9 mg/dL (ref 6–20)
CO2: 24 mmol/L (ref 22–32)
Calcium: 8.9 mg/dL (ref 8.9–10.3)
Chloride: 110 mmol/L (ref 98–111)
Creatinine, Ser: 0.74 mg/dL (ref 0.44–1.00)
GFR calc Af Amer: >60 mL/min (ref >60)
GFR calc non Af Amer: >60 mL/min (ref >60)
Glucose, Bld: 92 mg/dL (ref 70–99)
Potassium: 3.9 mmol/L (ref 3.5–5.1)
Sodium: 141 mmol/L (ref 135–145)

## 2020-05-01 LAB — CBC
HCT: 40.2 % (ref 36.0–46.0)
Hemoglobin: 13.5 g/dL (ref 12.0–15.0)
MCH: 34 pg (ref 26.0–34.0)
MCHC: 33.6 g/dL (ref 30.0–36.0)
MCV: 101.3 fL — ABNORMAL HIGH (ref 80.0–100.0)
Platelets: 409 10*3/uL — ABNORMAL HIGH (ref 150–400)
RBC: 3.97 MIL/uL (ref 3.87–5.11)
RDW: 13.8 % (ref 11.5–15.5)
WBC: 14.6 10*3/uL — ABNORMAL HIGH (ref 4.0–10.5)
nRBC: 0 % (ref 0.0–0.2)

## 2020-05-01 MED ORDER — NITROFURANTOIN MONOHYD MACRO 100 MG PO CAPS
100.0000 mg | ORAL_CAPSULE | Freq: Two times a day (BID) | ORAL | 0 refills | Status: AC
Start: 2020-05-01 — End: 2020-05-06

## 2020-05-01 NOTE — Discharge Planning (Signed)
RNCM met with pt at bedside regarding homelessness.  RNCM provided homeless resource list and advised pt to call [facility] number prior to leaving today.

## 2020-05-01 NOTE — Discharge Instructions (Addendum)
Take antibiotic as prescribed for your urinary tract infection.  If you develop fever, abdominal pain, vomiting, back pain or other concerns symptom, recommend return to ER for reassessment.  Strongly recommend follow-up with your primary care doctor.  Please see below for homelessness resources.   Homeless Resources [place] [facility] [phone]  [facility]) [address] [place] [phone] M-F 8:00-3:00; S-S 8:00-2:00  [facility] Shel[facility] (Men and Women) [address] [phone]  [facility] [place] (Men and Women) [address] [place] [phone]  [employer] (Domestic Violence Shelter) [address] [place] [phone]  [facility] (Men) [address] [employer] [phone]  [employer] (Single women and women with children) [address] [place] [phone]

## 2020-05-01 NOTE — ED Triage Notes (Signed)
Pt presents to ED BIB [facility]. Pt c/o noncompliance with MS medications since 2008. Pt c/o loss of bowel and bladder states, "we need to do something." EMS VS 130/80  HR - 80 RR - 18  100%

## 2020-05-01 NOTE — ED Notes (Signed)
Urine sent to lab 

## 2020-05-01 NOTE — ED Provider Notes (Signed)
[facility] EMERGENCY DEPARTMENT Provider Note   CSN: [id number] Arrival date & time: 05/01/20  0342     History Chief Complaint  Patient presents with  . Urinary Incontinence  . Encopresis    Emily [name] is a 44 y.o. female.  Presents to ER with concern for urinary and bowel incontinence.  Patient reports that she has had loss of her bladder and bowel control over the past few years.  She has not had any changes in the symptoms lately.  States that she has been evaluated by many other doctors has not had any changes in symptoms.  Denies any abdominal pain, no chest pain, back pain, vomiting.  No numbness, weakness, saddle anesthesia.  Denies prior history IVDU.  Patient reports that she is currently homeless, requesting resources.  Schizoaffective disorder.  HPI     Past Medical History:  Diagnosis Date  . Anxiety   . MDD (major depressive disorder)   . MS (multiple sclerosis) [facility])     Patient Active Problem List   Diagnosis Date Noted  . Schizoaffective disorder (HCC)   . 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 Abnormal MRI   . Altered mental status   . 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 Acute encephalopathy 02/21/2017    Past Surgical History:  Procedure Laterality Date  . ANGIOPLASTY       OB History   No obstetric history on file.     Family History  Family history unknown: Yes    Social History   Tobacco Use  . Smoking status: Former Games developer  . Smokeless tobacco: Never Used  Substance Use Topics  . Alcohol use: Yes  . Drug use: Yes    Types: Marijuana    Home Medications Prior to Admission medications   Medication Sig Start Date End Date Taking? Authorizing Provider  ibuprofen (ADVIL) 200 MG tablet Take 200 mg by mouth every 6 (six) hours as needed for moderate pain.   Yes [provider]  carbamazepine (TEGRETOL) 100 MG chewable tablet 1 in am 2 at hs Patient not taking:  Reported on 04/15/2020 10/30/19   Malvin Johns, MD  nitrofurantoin, macrocrystal-monohydrate, (MACROBID) 100 MG capsule Take 1 capsule (100 mg total) by mouth 2 (two) times daily for 5 days. 05/01/20 05/06/20  Milagros Loll, MD  OLANZapine (ZYPREXA) 20 MG tablet Take 1 tablet (20 mg total) by mouth at bedtime. Patient not taking: Reported on 04/15/2020 10/30/19   Malvin Johns, MD    Allergies    Latex and Penicillins  Review of Systems   Review of Systems  Constitutional: Negative for chills and fever.  HENT: Negative for ear pain and sore throat.   Eyes: Negative for pain and visual disturbance.  Respiratory: Negative for cough and shortness of breath.   Cardiovascular: Negative for chest pain and palpitations.  Gastrointestinal: Negative for abdominal pain and vomiting.  Genitourinary: Negative for dysuria and hematuria.  Musculoskeletal: Negative for arthralgias and back pain.  Skin: Negative for color change and rash.  Neurological: Negative for seizures and syncope.  All other systems reviewed and are negative.   Physical Exam Updated Vital Signs BP 118/73 (BP Location: Right Arm)   Pulse 72   Temp 97.6 F (36.4 C) (Oral)   Resp 16   Ht 5\' 8"  (1.727 m)   Wt 65.8 kg   SpO2 100%   BMI 22.05 kg/m   Physical Exam Vitals and nursing note reviewed.  Constitutional:  General: She is not in acute distress.    Appearance: She is well-developed.  HENT:     Head: Normocephalic and atraumatic.  Eyes:     Conjunctiva/sclera: Conjunctivae normal.  Cardiovascular:     Rate and Rhythm: Normal rate and regular rhythm.     Heart sounds: No murmur heard.   Pulmonary:     Effort: Pulmonary effort is normal. No respiratory distress.     Breath sounds: Normal breath sounds.  Abdominal:     Palpations: Abdomen is soft.     Tenderness: There is no abdominal tenderness.  Musculoskeletal:        General: No deformity or signs of injury.     Cervical back: Neck supple.   Skin:    General: Skin is warm and dry.  Neurological:     General: No focal deficit present.     Mental Status: She is alert and oriented to person, place, and time.     Sensory: No sensory deficit.     Motor: No weakness.     Gait: Gait normal.     ED Results / Procedures / Treatments   Labs (all labs ordered are listed, but only abnormal results are displayed) Labs Reviewed  CBC - Abnormal; Notable for the following components:      Result Value   WBC 14.6 (*)    MCV 101.3 (*)    Platelets 409 (*)    All other components within normal limits  URINALYSIS, ROUTINE W REFLEX MICROSCOPIC - Abnormal; Notable for the following components:   APPearance CLOUDY (*)    Hgb urine dipstick SMALL (*)    Protein, ur 30 (*)    Leukocytes,Ua LARGE (*)    WBC, UA >50 (*)    Bacteria, UA RARE (*)    Non Squamous Epithelial 0-5 (*)    All other components within normal limits  BASIC METABOLIC PANEL  I-STAT BETA HCG BLOOD, ED ([facility], [facility], [facility] ONLY)    EKG None  Radiology No results found.  Procedures Procedures (including critical care time)  Medications Ordered in ED Medications - No data to display  ED Course  I have reviewed the triage vital signs and the nursing notes.  Pertinent labs & imaging results that were available during my care of the patient were reviewed by me and considered in my medical decision making (see chart for details).    MDM Rules/Calculators/A&P                          44 year old lady presenting to ER with reported urinary and bowel incontinence.  On exam, patient is well-appearing in no distress, normal neurologic exam.  No other associated symptoms.  Of note, patient did not have any episodes of bladder or bowel incontinence while in ER.  Labs grossly negative, UA demonstrating likely UTI.  Will cover with Rx for Macrobid.  Given the chronicity and her complaint, reassuring work-up, reassuring exam, do not feel patient needs additional work-up at this  time.  Recommend follow-up with primary doctor.  Patient also concerned about homelessness, case management came by to provide and discuss resources.    After the discussed management above, the patient was determined to be safe for discharge.  The patient was in agreement with this plan and all questions regarding their care were answered.  ED return precautions were discussed and the patient will return to the ED with any significant worsening of condition.  Final Clinical Impression(s) / ED Diagnoses Final diagnoses:  Urinary tract infection without hematuria, site unspecified  Homeless    Rx / DC Orders ED Discharge Orders         Ordered    nitrofurantoin, macrocrystal-monohydrate, (MACROBID) 100 MG capsule  2 times daily     Discontinue  Reprint     05/01/20 1028           Milagros Loll, MD 05/01/20 1614

## 2020-05-20 ENCOUNTER — Ambulatory Visit (HOSPITAL_COMMUNITY): Payer: Self-pay | Admitting: Clinical

## 2020-05-20 ENCOUNTER — Encounter (HOSPITAL_COMMUNITY): Payer: Self-pay | Admitting: Psychiatry

## 2020-05-20 ENCOUNTER — Ambulatory Visit (HOSPITAL_COMMUNITY): Payer: Medicaid Other | Admitting: Psychiatry

## 2020-07-12 ENCOUNTER — Emergency Department (HOSPITAL_COMMUNITY): Payer: Self-pay

## 2020-07-12 ENCOUNTER — Inpatient Hospital Stay (HOSPITAL_COMMUNITY)
Admission: EM | Admit: 2020-07-12 | Discharge: 2020-08-15 | DRG: 004 | Disposition: E | Payer: Self-pay | Attending: Internal Medicine | Admitting: Internal Medicine

## 2020-07-12 ENCOUNTER — Encounter (HOSPITAL_COMMUNITY): Payer: Self-pay | Admitting: Emergency Medicine

## 2020-07-12 DIAGNOSIS — Z87891 Personal history of nicotine dependence: Secondary | ICD-10-CM | POA: Diagnosis not present

## 2020-07-12 DIAGNOSIS — G9340 Encephalopathy, unspecified: Secondary | ICD-10-CM | POA: Diagnosis present

## 2020-07-12 DIAGNOSIS — W109XXA Fall (on) (from) unspecified stairs and steps, initial encounter: Secondary | ICD-10-CM | POA: Diagnosis present

## 2020-07-12 DIAGNOSIS — J9602 Acute respiratory failure with hypercapnia: Secondary | ICD-10-CM | POA: Diagnosis present

## 2020-07-12 DIAGNOSIS — S066X9A Traumatic subarachnoid hemorrhage with loss of consciousness of unspecified duration, initial encounter: Principal | ICD-10-CM | POA: Diagnosis present

## 2020-07-12 DIAGNOSIS — F1290 Cannabis use, unspecified, uncomplicated: Secondary | ICD-10-CM | POA: Diagnosis present

## 2020-07-12 DIAGNOSIS — G9608 Other cranial cerebrospinal fluid leak: Secondary | ICD-10-CM | POA: Diagnosis present

## 2020-07-12 DIAGNOSIS — Y92009 Unspecified place in unspecified non-institutional (private) residence as the place of occurrence of the external cause: Secondary | ICD-10-CM

## 2020-07-12 DIAGNOSIS — R52 Pain, unspecified: Secondary | ICD-10-CM

## 2020-07-12 DIAGNOSIS — R339 Retention of urine, unspecified: Secondary | ICD-10-CM | POA: Diagnosis present

## 2020-07-12 DIAGNOSIS — F259 Schizoaffective disorder, unspecified: Secondary | ICD-10-CM | POA: Diagnosis present

## 2020-07-12 DIAGNOSIS — F329 Major depressive disorder, single episode, unspecified: Secondary | ICD-10-CM | POA: Diagnosis present

## 2020-07-12 DIAGNOSIS — R4182 Altered mental status, unspecified: Secondary | ICD-10-CM | POA: Diagnosis present

## 2020-07-12 DIAGNOSIS — S2249XA Multiple fractures of ribs, unspecified side, initial encounter for closed fracture: Secondary | ICD-10-CM

## 2020-07-12 DIAGNOSIS — Z66 Do not resuscitate: Secondary | ICD-10-CM | POA: Diagnosis not present

## 2020-07-12 DIAGNOSIS — G35 Multiple sclerosis: Secondary | ICD-10-CM | POA: Diagnosis present

## 2020-07-12 DIAGNOSIS — S2241XA Multiple fractures of ribs, right side, initial encounter for closed fracture: Secondary | ICD-10-CM | POA: Diagnosis present

## 2020-07-12 DIAGNOSIS — U071 COVID-19: Secondary | ICD-10-CM | POA: Diagnosis present

## 2020-07-12 DIAGNOSIS — H5704 Mydriasis: Secondary | ICD-10-CM | POA: Diagnosis present

## 2020-07-12 DIAGNOSIS — S062X9A Diffuse traumatic brain injury with loss of consciousness of unspecified duration, initial encounter: Secondary | ICD-10-CM | POA: Diagnosis present

## 2020-07-12 DIAGNOSIS — Z681 Body mass index (BMI) 19 or less, adult: Secondary | ICD-10-CM

## 2020-07-12 DIAGNOSIS — I609 Nontraumatic subarachnoid hemorrhage, unspecified: Secondary | ICD-10-CM

## 2020-07-12 DIAGNOSIS — R1310 Dysphagia, unspecified: Secondary | ICD-10-CM | POA: Diagnosis present

## 2020-07-12 DIAGNOSIS — Z91040 Latex allergy status: Secondary | ICD-10-CM | POA: Diagnosis not present

## 2020-07-12 DIAGNOSIS — Z880 Allergy status to penicillin: Secondary | ICD-10-CM | POA: Diagnosis not present

## 2020-07-12 DIAGNOSIS — S062XAA Diffuse traumatic brain injury with loss of consciousness status unknown, initial encounter: Secondary | ICD-10-CM

## 2020-07-12 DIAGNOSIS — S36119A Unspecified injury of liver, initial encounter: Secondary | ICD-10-CM | POA: Diagnosis present

## 2020-07-12 DIAGNOSIS — J14 Pneumonia due to Hemophilus influenzae: Secondary | ICD-10-CM | POA: Diagnosis present

## 2020-07-12 DIAGNOSIS — R40243 Glasgow coma scale score 3-8, unspecified time: Secondary | ICD-10-CM | POA: Diagnosis present

## 2020-07-12 DIAGNOSIS — S32049A Unspecified fracture of fourth lumbar vertebra, initial encounter for closed fracture: Secondary | ICD-10-CM | POA: Diagnosis present

## 2020-07-12 DIAGNOSIS — S022XXA Fracture of nasal bones, initial encounter for closed fracture: Secondary | ICD-10-CM | POA: Diagnosis present

## 2020-07-12 DIAGNOSIS — J9601 Acute respiratory failure with hypoxia: Secondary | ICD-10-CM | POA: Diagnosis present

## 2020-07-12 DIAGNOSIS — F419 Anxiety disorder, unspecified: Secondary | ICD-10-CM | POA: Diagnosis present

## 2020-07-12 DIAGNOSIS — R402432 Glasgow coma scale score 3-8, at arrival to emergency department: Secondary | ICD-10-CM

## 2020-07-12 DIAGNOSIS — Z930 Tracheostomy status: Secondary | ICD-10-CM

## 2020-07-12 DIAGNOSIS — Z9289 Personal history of other medical treatment: Secondary | ICD-10-CM

## 2020-07-12 DIAGNOSIS — Z515 Encounter for palliative care: Secondary | ICD-10-CM | POA: Diagnosis not present

## 2020-07-12 DIAGNOSIS — Z79899 Other long term (current) drug therapy: Secondary | ICD-10-CM | POA: Diagnosis not present

## 2020-07-12 DIAGNOSIS — Z978 Presence of other specified devices: Secondary | ICD-10-CM

## 2020-07-12 DIAGNOSIS — G319 Degenerative disease of nervous system, unspecified: Secondary | ICD-10-CM | POA: Diagnosis present

## 2020-07-12 LAB — URINALYSIS, ROUTINE W REFLEX MICROSCOPIC
Bilirubin Urine: NEGATIVE
Glucose, UA: NEGATIVE mg/dL
Ketones, ur: 20 mg/dL — AB
Leukocytes,Ua: NEGATIVE
Nitrite: NEGATIVE
Protein, ur: 30 mg/dL — AB
Specific Gravity, Urine: 1.034 — ABNORMAL HIGH (ref 1.005–1.030)
pH: 6 (ref 5.0–8.0)

## 2020-07-12 LAB — COMPREHENSIVE METABOLIC PANEL
ALT: 76 U/L — ABNORMAL HIGH (ref 0–44)
AST: 48 U/L — ABNORMAL HIGH (ref 15–41)
Albumin: 4 g/dL (ref 3.5–5.0)
Alkaline Phosphatase: 110 U/L (ref 38–126)
Anion gap: 12 (ref 5–15)
BUN: 15 mg/dL (ref 6–20)
CO2: 21 mmol/L — ABNORMAL LOW (ref 22–32)
Calcium: 9.4 mg/dL (ref 8.9–10.3)
Chloride: 106 mmol/L (ref 98–111)
Creatinine, Ser: 0.63 mg/dL (ref 0.44–1.00)
GFR calc Af Amer: >60 mL/min (ref >60)
GFR calc non Af Amer: >60 mL/min (ref >60)
Glucose, Bld: 99 mg/dL (ref 70–99)
Potassium: 3.6 mmol/L (ref 3.5–5.1)
Sodium: 139 mmol/L (ref 135–145)
Total Bilirubin: 1.1 mg/dL (ref 0.3–1.2)
Total Protein: 7.2 g/dL (ref 6.5–8.1)

## 2020-07-12 LAB — I-STAT CHEM 8, ED
BUN: 15 mg/dL (ref 6–20)
Calcium, Ion: 1.16 mmol/L (ref 1.15–1.40)
Chloride: 108 mmol/L (ref 98–111)
Creatinine, Ser: 0.6 mg/dL (ref 0.44–1.00)
Glucose, Bld: 93 mg/dL (ref 70–99)
HCT: 40 % (ref 36.0–46.0)
Hemoglobin: 13.6 g/dL (ref 12.0–15.0)
Potassium: 3.6 mmol/L (ref 3.5–5.1)
Sodium: 141 mmol/L (ref 135–145)
TCO2: 22 mmol/L (ref 22–32)

## 2020-07-12 LAB — I-STAT ARTERIAL BLOOD GAS, ED
Acid-Base Excess: 0 mmol/L (ref 0.0–2.0)
Bicarbonate: 24 mmol/L (ref 20.0–28.0)
Calcium, Ion: 1.15 mmol/L (ref 1.15–1.40)
HCT: 33 % — ABNORMAL LOW (ref 36.0–46.0)
Hemoglobin: 11.2 g/dL — ABNORMAL LOW (ref 12.0–15.0)
O2 Saturation: 100 %
Patient temperature: 92.6
Potassium: 3.2 mmol/L — ABNORMAL LOW (ref 3.5–5.1)
Sodium: 141 mmol/L (ref 135–145)
TCO2: 25 mmol/L (ref 22–32)
pCO2 arterial: 32 mmHg (ref 32.0–48.0)
pH, Arterial: 7.47 — ABNORMAL HIGH (ref 7.350–7.450)
pO2, Arterial: 527 mmHg — ABNORMAL HIGH (ref 83.0–108.0)

## 2020-07-12 LAB — ETHANOL: Alcohol, Ethyl (B): <10 mg/dL (ref <10)

## 2020-07-12 LAB — CBC
HCT: 40.4 % (ref 36.0–46.0)
Hemoglobin: 13.5 g/dL (ref 12.0–15.0)
MCH: 33.3 pg (ref 26.0–34.0)
MCHC: 33.4 g/dL (ref 30.0–36.0)
MCV: 99.8 fL (ref 80.0–100.0)
Platelets: 286 10*3/uL (ref 150–400)
RBC: 4.05 MIL/uL (ref 3.87–5.11)
RDW: 13.5 % (ref 11.5–15.5)
WBC: 11.9 10*3/uL — ABNORMAL HIGH (ref 4.0–10.5)
nRBC: 0 % (ref 0.0–0.2)

## 2020-07-12 LAB — RAPID URINE DRUG SCREEN, HOSP PERFORMED
Amphetamines: NOT DETECTED
Barbiturates: NOT DETECTED
Benzodiazepines: NOT DETECTED
Cocaine: NOT DETECTED
Opiates: NOT DETECTED
Tetrahydrocannabinol: POSITIVE — AB

## 2020-07-12 LAB — SARS CORONAVIRUS 2 BY RT PCR (HOSPITAL ORDER, PERFORMED IN ~~LOC~~ HOSPITAL LAB): SARS Coronavirus 2: POSITIVE — AB

## 2020-07-12 LAB — LITHIUM LEVEL: Lithium Lvl: 0.11 mmol/L — ABNORMAL LOW (ref 0.60–1.20)

## 2020-07-12 LAB — SALICYLATE LEVEL: Salicylate Lvl: <7 mg/dL — ABNORMAL LOW (ref 7.0–30.0)

## 2020-07-12 LAB — ACETAMINOPHEN LEVEL: Acetaminophen (Tylenol), Serum: <10 ug/mL — ABNORMAL LOW (ref 10–30)

## 2020-07-12 LAB — I-STAT BETA HCG BLOOD, ED (MC, WL, AP ONLY): I-stat hCG, quantitative: <5 m[IU]/mL (ref <5)

## 2020-07-12 MED ORDER — PROPOFOL 1000 MG/100ML IV EMUL: 5.0000 ug/kg/min | INTRAVENOUS | Status: DC

## 2020-07-12 MED ORDER — HYDROMORPHONE HCL 1 MG/ML IJ SOLN
INTRAMUSCULAR | Status: AC
  Administered 2020-07-12: 1 mg via INTRAVENOUS
  Filled 2020-07-12: qty 1

## 2020-07-12 MED ORDER — SODIUM CHLORIDE 0.9 % IV SOLN
200.0000 mg | Freq: Once | INTRAVENOUS | Status: DC
  Administered 2020-07-13: 200 mg via INTRAVENOUS
  Filled 2020-07-12: qty 40

## 2020-07-12 MED ORDER — INSULIN ASPART 100 UNIT/ML ~~LOC~~ SOLN
0.0000 [IU] | SUBCUTANEOUS | Status: DC
  Administered 2020-07-14 – 2020-07-18 (×11): 2 [IU] via SUBCUTANEOUS
  Administered 2020-07-18: 3 [IU] via SUBCUTANEOUS
  Administered 2020-07-18: 2 [IU] via SUBCUTANEOUS
  Administered 2020-07-19: 3 [IU] via SUBCUTANEOUS
  Administered 2020-07-19 – 2020-07-21 (×10): 2 [IU] via SUBCUTANEOUS

## 2020-07-12 MED ORDER — PANTOPRAZOLE SODIUM 40 MG IV SOLR
40.0000 mg | Freq: Every day | INTRAVENOUS | Status: DC
  Administered 2020-07-13 – 2020-07-14 (×2): 40 mg via INTRAVENOUS
  Filled 2020-07-12 (×2): qty 40

## 2020-07-12 MED ORDER — IOHEXOL 300 MG/ML  SOLN
100.0000 mL | Freq: Once | INTRAMUSCULAR | Status: AC | PRN
  Administered 2020-07-12: 100 mL via INTRAVENOUS

## 2020-07-12 MED ORDER — POLYETHYLENE GLYCOL 3350 17 G PO PACK: 17.0000 g | PACK | Freq: Every day | ORAL | Status: DC | PRN

## 2020-07-12 MED ORDER — PROPOFOL 1000 MG/100ML IV EMUL
INTRAVENOUS | Status: AC | PRN
  Administered 2020-07-12: 15 ug/kg/min via INTRAVENOUS

## 2020-07-12 MED ORDER — DOCUSATE SODIUM 100 MG PO CAPS: 100.0000 mg | ORAL_CAPSULE | Freq: Two times a day (BID) | ORAL | Status: DC | PRN

## 2020-07-12 MED ORDER — METHYLPREDNISOLONE SODIUM SUCC 40 MG IJ SOLR
0.5000 mg/kg | Freq: Two times a day (BID) | INTRAMUSCULAR | Status: DC
  Administered 2020-07-13: 33.2 mg via INTRAVENOUS
  Filled 2020-07-12: qty 1

## 2020-07-12 MED ORDER — SODIUM CHLORIDE 0.9 % IV SOLN
INTRAVENOUS | Status: AC | PRN
  Administered 2020-07-12: 1000 mL via INTRAVENOUS

## 2020-07-12 MED ORDER — ETOMIDATE 2 MG/ML IV SOLN
INTRAVENOUS | Status: AC | PRN
  Administered 2020-07-12: 20 mg via INTRAVENOUS

## 2020-07-12 MED ORDER — SUCCINYLCHOLINE CHLORIDE 20 MG/ML IJ SOLN
INTRAMUSCULAR | Status: AC | PRN
  Administered 2020-07-12: 120 mg via INTRAVENOUS

## 2020-07-12 MED ORDER — HYDROMORPHONE HCL 1 MG/ML IJ SOLN: 1.0000 mg | Freq: Once | INTRAMUSCULAR | Status: AC

## 2020-07-12 MED ORDER — SODIUM CHLORIDE 0.9 % IV SOLN
100.0000 mg | Freq: Every day | INTRAVENOUS | Status: DC
  Filled 2020-07-12: qty 20
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
## 2020-07-12 NOTE — ED Provider Notes (Signed)
[facility] EMERGENCY DEPARTMENT Provider Note   CSN: [id number] Arrival date & time: Jul 13, 2020  1936     History Chief Complaint  Patient presents with  . Altered Mental Status    Emily [name] is a 44 y.o. female.  44 year old female presents for evaluation.  Patient was reportedly found in her hotel room with a significant other.  Significant other reported to EMS that the patient had had a fall 2 days prior.  After the fall the patient was reportedly in bed until this evening.  Patient is unable to provide history.  Patient is obtunded and nonverbal.  Multiple contusions of various ages overlying the face and thorax are noted.  The history is provided by medical records and the EMS personnel.  Altered Mental Status Presenting symptoms: lethargy and unresponsiveness   Severity:  Mild Most recent episode:  Today Episode history:  Unable to specify Timing:  Unable to specify Progression:  Unable to specify      Past Medical History:  Diagnosis Date  . Anxiety   . MDD (major depressive disorder)   . MS (multiple sclerosis) [facility])     Patient Active Problem List   Diagnosis Date Noted  . Schizoaffective disorder (HCC)   . 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 Abnormal MRI   . Altered mental status   . 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 Acute encephalopathy 02/21/2017    Past Surgical History:  Procedure Laterality Date  . ANGIOPLASTY       OB History   No obstetric history on file.     Family History  Family history unknown: Yes    Social History   Tobacco Use  . Smoking status: Former Games developer  . Smokeless tobacco: Never Used  Substance Use Topics  . Alcohol use: Yes  . Drug use: Yes    Types: Marijuana    Home Medications Prior to Admission medications   Medication Sig Start Date End Date Taking? Authorizing Provider  carbamazepine (TEGRETOL) 100 MG chewable tablet 1 in am 2 at  hs Patient not taking: Reported on 04/15/2020 10/30/19   Malvin Johns, MD  ibuprofen (ADVIL) 200 MG tablet Take 200 mg by mouth every 6 (six) hours as needed for moderate pain.    [provider]  OLANZapine (ZYPREXA) 20 MG tablet Take 1 tablet (20 mg total) by mouth at bedtime. Patient not taking: Reported on 04/15/2020 10/30/19   Malvin Johns, MD    Allergies    Latex and Penicillins  Review of Systems   Review of Systems  Unable to perform ROS: Acuity of condition    Physical Exam Updated Vital Signs BP (!) 133/100   Pulse 73   Temp (!) 93 F (33.9 C)   Resp 15   Ht 5\' 8"  (1.727 m)   Wt 66.5 kg   SpO2 100%   BMI 22.29 kg/m   Physical Exam Vitals and nursing note reviewed.  Constitutional:      General: She is in acute distress.     Appearance: She is well-developed.  HENT:     Head: Normocephalic.     Comments: Multiple contusions of various ages noted over the patient's face, thorax, and extremities Eyes:     Conjunctiva/sclera: Conjunctivae normal.     Pupils: Pupils are equal, round, and reactive to light.  Cardiovascular:     Rate and Rhythm: Normal rate and regular rhythm.     Heart sounds:  Normal heart sounds.  Pulmonary:     Effort: Pulmonary effort is normal. No respiratory distress.     Breath sounds: Normal breath sounds.  Abdominal:     General: There is no distension.     Palpations: Abdomen is soft.     Tenderness: There is no abdominal tenderness.  Musculoskeletal:        General: No deformity.     Cervical back: Normal range of motion and neck supple.     Comments: Multiple contusions of various agents overlying the chest, back, and extremities  Skin:    General: Skin is warm and dry.  Neurological:     Comments: Obtunded  No spontaneous eye opening Nonverbal Withdraws all 4 extremities to painful stimulation  Patient is swallowing her own secretions.  No evidence of significant respiratory distress on arrival      ED Results /  Procedures / Treatments   Labs (all labs ordered are listed, but only abnormal results are displayed) Labs Reviewed  SARS CORONAVIRUS 2 BY RT PCR (HOSPITAL ORDER, PERFORMED IN  HOSPITAL LAB) - Abnormal; Notable for the following components:      Result Value   SARS Coronavirus 2 POSITIVE (*)    All other components within normal limits  LITHIUM LEVEL - Abnormal; Notable for the following components:   Lithium Lvl 0.11 (*)    All other components within normal limits  CBC - Abnormal; Notable for the following components:   WBC 11.9 (*)    All other components within normal limits  COMPREHENSIVE METABOLIC PANEL - Abnormal; Notable for the following components:   CO2 21 (*)    AST 48 (*)    ALT 76 (*)    All other components within normal limits  RAPID URINE DRUG SCREEN, HOSP PERFORMED - Abnormal; Notable for the following components:   Tetrahydrocannabinol POSITIVE (*)    All other components within normal limits  URINALYSIS, ROUTINE W REFLEX MICROSCOPIC - Abnormal; Notable for the following components:   Color, Urine AMBER (*)    APPearance HAZY (*)    Specific Gravity, Urine 1.034 (*)    Hgb urine dipstick SMALL (*)    Ketones, ur 20 (*)    Protein, ur 30 (*)    Bacteria, UA RARE (*)    All other components within normal limits  ACETAMINOPHEN LEVEL - Abnormal; Notable for the following components:   Acetaminophen (Tylenol), Serum <10 (*)    All other components within normal limits  SALICYLATE LEVEL - Abnormal; Notable for the following components:   Salicylate Lvl <7.0 (*)    All other components within normal limits  I-STAT ARTERIAL BLOOD GAS, ED - Abnormal; Notable for the following components:   pH, Arterial 7.470 (*)    pO2, Arterial 527 (*)    Potassium 3.2 (*)    HCT 33.0 (*)    Hemoglobin 11.2 (*)    All other components within normal limits  ETHANOL  I-STAT CHEM 8, ED  I-STAT BETA HCG BLOOD, ED ([facility], [facility], [facility] ONLY)    EKG None  Radiology CT Head  Wo Contrast  Result Date: 2020-07-21 CLINICAL DATA:  Mental status change.  Found unresponsive. EXAM: CT HEAD WITHOUT CONTRAST TECHNIQUE: Contiguous axial images were obtained from the base of the skull through the vertex without intravenous contrast. COMPARISON:  Head CT and brain MRI 02/21/2017 FINDINGS: Brain: Acute subarachnoid hemorrhage involving the right temporal occipital and parietal regions, for example series 3, image 23. Acute subarachnoid hemorrhage  on the left involving the temporal and occipital regions. Small amount of subarachnoid hemorrhage also suspected involving the bilateral cerebellar hemispheres. As well as there are bilateral subdural collections. This is larger on the right where it measures up to 11 mm in thickness in the frontal region, measured on image 18 series 3. On the left this measures up to 5 mm, series 3, image 25. These collections are isodense to minimally hyperdense to CSF. There is no evidence of acute subdural hemorrhage component. Despite the subdural collection being larger on the right there is 2 mm left to right midline shift. Stable ventricular size without hydrocephalus. Periventricular white matter hypodensity likely secondary to white matter lesions on prior brain MRI. The basilar cisterns are patent. Vascular: No hyperdense vessel. Skull: No fracture or focal lesion. Sinuses/Orbits: Assessed on concurrent face CT, reported separately. Other: No confluent scalp contusion. IMPRESSION: 1. Acute subarachnoid hemorrhage involving the right temporal occipital and parietal regions. Acute subarachnoid hemorrhage on the left involving the temporal and occipital regions. Small amount of subarachnoid hemorrhage also suspected involving the bilateral cerebellar hemispheres. 2. Bilateral subdural collections, larger on the right measuring up to 11 mm in thickness. 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 There is no evidence of acute subdural hemorrhage component. Despite the subdural collection being larger on the right there is 2 mm left to right midline shift. 3. Periventricular white matter hypodensity likely secondary to white matter/multiple sclerosis lesions on prior brain MRI. Critical Value/emergent results were called by telephone at the time of interpretation on 06/16/2020 at 8:46 pm to Dr Kristine Royal , who verbally acknowledged these results. Electronically Signed   By: Narda Rutherford M.D.   On: 07/13/2020 20:46   CT Chest W Contrast  Result Date: 06/21/2020 CLINICAL DATA:  43 year old female with trauma. EXAM: CT CHEST, ABDOMEN, AND PELVIS WITH CONTRAST TECHNIQUE: Multidetector CT imaging of the chest, abdomen and pelvis was performed following the standard protocol during bolus administration of intravenous contrast. CONTRAST:  OMNIPAQUE IOHEXOL 300 MG/ML  SOLN COMPARISON:  CT of the chest abdomen pelvis dated 02/21/2017. FINDINGS: Evaluation is limited due to streak artifact caused by patient's arms and overlying support wires. CT CHEST FINDINGS Cardiovascular: There is no cardiomegaly or pericardial effusion. The thoracic aorta is unremarkable. The origins of the great vessels of the aortic arch appear patent as visualized. The central pulmonary arteries appear unremarkable. Mediastinum/Nodes: No hilar or mediastinal adenopathy. The esophagus is grossly unremarkable. An enteric tube is noted within the esophagus extending into the stomach. No mediastinal fluid collection or hematoma. Lungs/Pleura: An endotracheal tube is noted with tip entering in the left mainstem bronchus. Recommend retraction by approximately 4 cm for optimal positioning. Faint diffuse right upper lobe hazy density may represent atelectasis, less likely aspiration or contusion. There is mucous impaction of a right upper lobe bronchus (43/5). Clusters of ground-glass and nodular densities in the lower  lobes most likely aspiration. There is mucous impaction of the right lower lobe bronchi. Minimal bibasilar atelectasis. No large consolidation, pleural effusion, or pneumothorax. Musculoskeletal: Minimally displaced fractures of the right anterolateral sixth, seventh rib as well as age indeterminate fracture of the posterior right eleventh rib. There is an age indeterminate, likely subacute or old fracture of the upper portion of the sternal body with sclerotic margins. An infectious process is less likely but not excluded clinical correlation is recommended. No fluid collection. CT ABDOMEN PELVIS FINDINGS No  intra-abdominal free air or free fluid. Hepatobiliary: The liver is grossly unremarkable. No intrahepatic biliary dilatation. The gallbladder is unremarkable. Pancreas: Unremarkable. No pancreatic ductal dilatation or surrounding inflammatory changes. Spleen: Normal in size without focal abnormality. Adrenals/Urinary Tract: The adrenal glands unremarkable. There is no hydronephrosis on either side. There is symmetric enhancement and excretion of contrast by both kidneys. The visualized ureters appear unremarkable. Urinary bladder is decompressed around a Foley catheter. Stomach/Bowel: An enteric tube with tip in the body of the stomach. There is moderate stool throughout the colon. There is no bowel obstruction or active inflammation. The appendix is normal. Vascular/Lymphatic: The abdominal aorta and IVC are unremarkable. No portal venous gas. There is no adenopathy. There is mild diffuse stranding of the small bowel mesentery with vascular engorgement which may represent mild contusion. No hematoma or fluid collection. Reproductive: Enlarged uterus, likely related to underlying fibroid. Ultrasound may provide better evaluation of the pelvic structures on a nonemergent/outpatient basis. Other: Mild anterior abdominal wall subcutaneous contusion. No large hematoma or fluid collection. Musculoskeletal:  Nondisplaced fracture of the left L4 transverse process. No other acute osseous pathology. IMPRESSION: 1. Endotracheal tube with tip in the left mainstem bronchus. Recommend retraction by approximately 4 cm. 2. Diffuse right upper lobe hazy density, likely atelectasis or less likely contusion/aspiration. 3. Mucous impaction of the right upper lobe and right lower lobe bronchi. Clusters of faint nodular density in the lower lobes most consistent with aspiration. 4. Minimally displaced fractures of the right anterolateral sixth, seventh, and eleventh ribs. No pneumothorax. 5. Age indeterminate, likely subacute or old fracture of the upper portion of the sternal body. 6. Nondisplaced fracture of the left L4 transverse process. 7. Mild diffuse stranding of the small bowel mesentery likely mild contusion. No hematoma or fluid collection. These results were called by telephone at the time of interpretation on 07-20-20 at 9:43 pm to provider [facility] , who verbally acknowledged these results. Electronically Signed   By: Elgie Collard M.D.   On: 07-20-20 21:44   CT Cervical Spine Wo Contrast  Result Date: 07/20/2020 CLINICAL DATA:  Neck trauma, intoxicated or obtunded (Age >= 16y) EXAM: CT CERVICAL SPINE WITHOUT CONTRAST TECHNIQUE: Multidetector CT imaging of the cervical spine was performed without intravenous contrast. Multiplanar CT image reconstructions were also generated. COMPARISON:  CT cervical spine 02/21/2017 FINDINGS: Alignment: Normal. Skull base and vertebrae: No acute fracture. Vertebral body heights are maintained. The dens and skull base are intact. Soft tissues and spinal canal: No prevertebral fluid or swelling. No visible canal hematoma. Disc levels:  Preserved. Upper chest: Negative. Other: None. IMPRESSION: No acute fracture or subluxation of the cervical spine. Electronically Signed   By: Narda Rutherford M.D.   On: 07/20/20 20:39   CT Abdomen Pelvis W Contrast  Result Date:  07/20/2020 CLINICAL DATA:  44 year old female with trauma. EXAM: CT CHEST, ABDOMEN, AND PELVIS WITH CONTRAST TECHNIQUE: Multidetector CT imaging of the chest, abdomen and pelvis was performed following the standard protocol during bolus administration of intravenous contrast. CONTRAST:  OMNIPAQUE IOHEXOL 300 MG/ML  SOLN COMPARISON:  CT of the chest abdomen pelvis dated 02/21/2017. FINDINGS: Evaluation is limited due to streak artifact caused by patient's arms and overlying support wires. CT CHEST FINDINGS Cardiovascular: There is no cardiomegaly or pericardial effusion. The thoracic aorta is unremarkable. The origins of the great vessels of the aortic arch appear patent as visualized. The central pulmonary arteries appear unremarkable. Mediastinum/Nodes: No hilar or mediastinal adenopathy. The esophagus is grossly unremarkable.  An enteric tube is noted within the esophagus extending into the stomach. No mediastinal fluid collection or hematoma. Lungs/Pleura: An endotracheal tube is noted with tip entering in the left mainstem bronchus. Recommend retraction by approximately 4 cm for optimal positioning. Faint diffuse right upper lobe hazy density may represent atelectasis, less likely aspiration or contusion. There is mucous impaction of a right upper lobe bronchus (43/5). Clusters of ground-glass and nodular densities in the lower lobes most likely aspiration. There is mucous impaction of the right lower lobe bronchi. Minimal bibasilar atelectasis. No large consolidation, pleural effusion, or pneumothorax. Musculoskeletal: Minimally displaced fractures of the right anterolateral sixth, seventh rib as well as age indeterminate fracture of the posterior right eleventh rib. There is an age indeterminate, likely subacute or old fracture of the upper portion of the sternal body with sclerotic margins. An infectious process is less likely but not excluded clinical correlation is recommended. No fluid collection. CT  ABDOMEN PELVIS FINDINGS No intra-abdominal free air or free fluid. Hepatobiliary: The liver is grossly unremarkable. No intrahepatic biliary dilatation. The gallbladder is unremarkable. Pancreas: Unremarkable. No pancreatic ductal dilatation or surrounding inflammatory changes. Spleen: Normal in size without focal abnormality. Adrenals/Urinary Tract: The adrenal glands unremarkable. There is no hydronephrosis on either side. There is symmetric enhancement and excretion of contrast by both kidneys. The visualized ureters appear unremarkable. Urinary bladder is decompressed around a Foley catheter. Stomach/Bowel: An enteric tube with tip in the body of the stomach. There is moderate stool throughout the colon. There is no bowel obstruction or active inflammation. The appendix is normal. Vascular/Lymphatic: The abdominal aorta and IVC are unremarkable. No portal venous gas. There is no adenopathy. There is mild diffuse stranding of the small bowel mesentery with vascular engorgement which may represent mild contusion. No hematoma or fluid collection. Reproductive: Enlarged uterus, likely related to underlying fibroid. Ultrasound may provide better evaluation of the pelvic structures on a nonemergent/outpatient basis. Other: Mild anterior abdominal wall subcutaneous contusion. No large hematoma or fluid collection. Musculoskeletal: Nondisplaced fracture of the left L4 transverse process. No other acute osseous pathology. IMPRESSION: 1. Endotracheal tube with tip in the left mainstem bronchus. Recommend retraction by approximately 4 cm. 2. Diffuse right upper lobe hazy density, likely atelectasis or less likely contusion/aspiration. 3. Mucous impaction of the right upper lobe and right lower lobe bronchi. Clusters of faint nodular density in the lower lobes most consistent with aspiration. 4. Minimally displaced fractures of the right anterolateral sixth, seventh, and eleventh ribs. No pneumothorax. 5. Age indeterminate,  likely subacute or old fracture of the upper portion of the sternal body. 6. Nondisplaced fracture of the left L4 transverse process. 7. Mild diffuse stranding of the small bowel mesentery likely mild contusion. No hematoma or fluid collection. These results were called by telephone at the time of interpretation on 07/10/2020 at 9:43 pm to provider [facility] , who verbally acknowledged these results. Electronically Signed   By: Elgie Collard M.D.   On: 07/02/2020 21:44   DG Chest Portable 1 View  Result Date: 07/11/2020 CLINICAL DATA:  Post intubation EXAM: PORTABLE CHEST 1 VIEW COMPARISON:  CT chest 03/12/2020 FINDINGS: Endotracheal tube tip is just above the carina. Esophageal tube tip is below the diaphragm. No focal opacity or pleural effusion. Normal heart size. No pneumothorax. IMPRESSION: Endotracheal tube tip is just above the carina. Lung fields are grossly clear Electronically Signed   By: Jasmine Pang M.D.   On: 06/16/2020 21:55   CT Maxillofacial Wo Contrast  Result Date: 08/02/2020 CLINICAL DATA:  Facial trauma.  Found unresponsive. EXAM: CT MAXILLOFACIAL WITHOUT CONTRAST TECHNIQUE: Multidetector CT imaging of the maxillofacial structures was performed. Multiplanar CT image reconstructions were also generated. COMPARISON:  None. FINDINGS: Osseous: Mildly displaced right nasal bone fracture. Zygomatic arches and mandibles are intact. There is mild rightward nasal septal bowing. The temporomandibular joints are congruent. Poor dentition with multiple dental caries and scattered absent teeth. No fracture of the pterygoid plates. Orbits: No acute orbital fracture. Both orbits and globes are intact. Dysconjugate gaze, typically incidental. Sinuses: Mild mucosal thickening of right side of sphenoid sinus and left maxillary sinus. No sinus fracture or fluid level Soft tissues: Negative. Limited intracranial: Assessed on concurrent head CT, reported separately. IMPRESSION: 1. Mildly displaced  right nasal bone fracture. 2. Poor dentition with multiple dental caries and scattered absent teeth. Electronically Signed   By: Narda Rutherford M.D.   On: Aug 02, 2020 20:41    Procedures Procedure Name: Intubation Date/Time: August 02, 2020 10:23 PM Performed by: Wynetta Fines, MD Pre-anesthesia Checklist: Patient identified, Patient being monitored, Emergency Drugs available, Timeout performed and Suction available Oxygen Delivery Method: Non-rebreather mask Preoxygenation: Pre-oxygenation with 100% oxygen Induction Type: Rapid sequence Ventilation: Mask ventilation without difficulty Laryngoscope Size: Glidescope and 4 Grade View: Grade I Tube size: 7.5 mm Number of attempts: 1 Placement Confirmation: ETT inserted through vocal cords under direct vision,  CO2 detector,  Breath sounds checked- equal and bilateral and Positive ETCO2 Secured at: 25 cm Tube secured with: ETT holder Dental Injury: Teeth and Oropharynx as per pre-operative assessment       (including critical care time) CRITICAL CARE Performed by: Wynetta Fines   Total critical care time: 45 minutes  Critical care time was exclusive of separately billable procedures and treating other patients.  Critical care was necessary to treat or prevent imminent or life-threatening deterioration.  Critical care was time spent personally by me on the following activities: development of treatment plan with patient and/or surrogate as well as nursing, discussions with consultants, evaluation of patient's response to treatment, examination of patient, obtaining history from patient or surrogate, ordering and performing treatments and interventions, ordering and review of laboratory studies, ordering and review of radiographic studies, pulse oximetry and re-evaluation of patient's condition.   Medications Ordered in ED Medications  0.9 %  sodium chloride infusion (1,000 mLs Intravenous New Bag/Given 2020/08/02 1958)  etomidate  (AMIDATE) injection (20 mg Intravenous Given 2020/08/02 2000)  succinylcholine (ANECTINE) injection (120 mg Intravenous Given 2020-08-02 2000)  propofol (DIPRIVAN) 1000 MG/100ML infusion (25 mcg/kg/min  65.8 kg (Order-Specific) Intravenous Rate/Dose Change Aug 02, 2020 2024)  HYDROmorphone (DILAUDID) injection 1 mg (1 mg Intravenous Given 2020-08-02 2007)  iohexol (OMNIPAQUE) 300 MG/ML solution 100 mL (100 mLs Intravenous Contrast Given 08-02-20 2113)    ED Course  I have reviewed the triage vital signs and the nursing notes.  Pertinent labs & imaging results that were available during my care of the patient were reviewed by me and considered in my medical decision making (see chart for details).    MDM Rules/Calculators/A&P                          MDM  Screen complete  ELISABETH STROM was evaluated in Emergency Department on 2020-08-02 for the symptoms described in the history of present illness. She was evaluated in the context of the global COVID-19 pandemic, which necessitated consideration that the patient might be at risk for infection with  the SARS-CoV-2 virus that causes COVID-19. Institutional protocols and algorithms that pertain to the evaluation of patients at risk for COVID-19 are in a state of rapid change based on information released by regulatory bodies including the CDC and federal and state organizations. These policies and algorithms were followed during the patient's care in the ED.  Patient is presenting for AMS with suspicion for possible traumatic brain injury.  Intubated on arrival for airway protection.  CT Brain imaging revealed subarachnoid hemorrhage.  Additional CT images pending at time of admission.   Critical care services are aware of case and will evaluate for admission.   Final Clinical Impression(s) / ED Diagnoses Final diagnoses:  Altered mental status, unspecified altered mental status type  SAH (subarachnoid hemorrhage) (HCC)    Rx / DC Orders ED  Discharge Orders    None       Wynetta Fines, MD 08/03/2020 2225

## 2020-07-12 NOTE — ED Triage Notes (Signed)
BIB EMS from home. Per boyfriend patient "fell" 2 days ago and has been laying in bed unresponsive ever since. Patient only responds to painful stimuli. Bruising all over body. L eye bruised and swollen. Hematoma to L side of face.

## 2020-07-12 NOTE — ED Notes (Signed)
Bair hugger applied.

## 2020-07-13 ENCOUNTER — Inpatient Hospital Stay (HOSPITAL_COMMUNITY): Payer: Self-pay

## 2020-07-13 DIAGNOSIS — R4182 Altered mental status, unspecified: Secondary | ICD-10-CM

## 2020-07-13 DIAGNOSIS — R569 Unspecified convulsions: Secondary | ICD-10-CM

## 2020-07-13 DIAGNOSIS — U071 COVID-19: Secondary | ICD-10-CM | POA: Diagnosis present

## 2020-07-13 DIAGNOSIS — S065X9A Traumatic subdural hemorrhage with loss of consciousness of unspecified duration, initial encounter: Secondary | ICD-10-CM

## 2020-07-13 LAB — PROCALCITONIN: Procalcitonin: <0.1 ng/mL

## 2020-07-13 LAB — TYPE AND SCREEN
ABO/RH(D): O POS
Antibody Screen: NEGATIVE

## 2020-07-13 LAB — C-REACTIVE PROTEIN: CRP: 0.6 mg/dL (ref <1.0)

## 2020-07-13 LAB — BASIC METABOLIC PANEL
Anion gap: 12 (ref 5–15)
BUN: 12 mg/dL (ref 6–20)
CO2: 20 mmol/L — ABNORMAL LOW (ref 22–32)
Calcium: 8.7 mg/dL — ABNORMAL LOW (ref 8.9–10.3)
Chloride: 109 mmol/L (ref 98–111)
Creatinine, Ser: 0.59 mg/dL (ref 0.44–1.00)
GFR calc Af Amer: >60 mL/min (ref >60)
GFR calc non Af Amer: >60 mL/min (ref >60)
Glucose, Bld: 103 mg/dL — ABNORMAL HIGH (ref 70–99)
Potassium: 3.6 mmol/L (ref 3.5–5.1)
Sodium: 141 mmol/L (ref 135–145)

## 2020-07-13 LAB — CBG MONITORING, ED
Glucose-Capillary: 85 mg/dL (ref 70–99)
Glucose-Capillary: 88 mg/dL (ref 70–99)
Glucose-Capillary: 92 mg/dL (ref 70–99)
Glucose-Capillary: 94 mg/dL (ref 70–99)
Glucose-Capillary: 99 mg/dL (ref 70–99)

## 2020-07-13 LAB — CK: Total CK: 437 U/L — ABNORMAL HIGH (ref 38–234)

## 2020-07-13 LAB — I-STAT ARTERIAL BLOOD GAS, ED
Acid-base deficit: 1 mmol/L (ref 0.0–2.0)
Bicarbonate: 22.4 mmol/L (ref 20.0–28.0)
Calcium, Ion: 1.18 mmol/L (ref 1.15–1.40)
HCT: 33 % — ABNORMAL LOW (ref 36.0–46.0)
Hemoglobin: 11.2 g/dL — ABNORMAL LOW (ref 12.0–15.0)
O2 Saturation: 96 %
Patient temperature: 97.7
Potassium: 3.2 mmol/L — ABNORMAL LOW (ref 3.5–5.1)
Sodium: 142 mmol/L (ref 135–145)
TCO2: 23 mmol/L (ref 22–32)
pCO2 arterial: 32.3 mmHg (ref 32.0–48.0)
pH, Arterial: 7.447 (ref 7.350–7.450)
pO2, Arterial: 77 mmHg — ABNORMAL LOW (ref 83.0–108.0)

## 2020-07-13 LAB — CBC
HCT: 35.3 % — ABNORMAL LOW (ref 36.0–46.0)
Hemoglobin: 12.1 g/dL (ref 12.0–15.0)
MCH: 34.1 pg — ABNORMAL HIGH (ref 26.0–34.0)
MCHC: 34.3 g/dL (ref 30.0–36.0)
MCV: 99.4 fL (ref 80.0–100.0)
Platelets: 280 10*3/uL (ref 150–400)
RBC: 3.55 MIL/uL — ABNORMAL LOW (ref 3.87–5.11)
RDW: 13.6 % (ref 11.5–15.5)
WBC: 18.1 10*3/uL — ABNORMAL HIGH (ref 4.0–10.5)
nRBC: 0 % (ref 0.0–0.2)

## 2020-07-13 LAB — ABO/RH: ABO/RH(D): O POS

## 2020-07-13 LAB — LACTATE DEHYDROGENASE: LDH: 267 U/L — ABNORMAL HIGH (ref 98–192)

## 2020-07-13 LAB — FERRITIN: Ferritin: 113 ng/mL (ref 11–307)

## 2020-07-13 LAB — FIBRINOGEN: Fibrinogen: 367 mg/dL (ref 210–475)

## 2020-07-13 LAB — LACTIC ACID, PLASMA: Lactic Acid, Venous: 1.2 mmol/L (ref 0.5–1.9)

## 2020-07-13 LAB — PHOSPHORUS: Phosphorus: 3 mg/dL (ref 2.5–4.6)

## 2020-07-13 LAB — D-DIMER, QUANTITATIVE: D-Dimer, Quant: 7.97 ug/mL-FEU — ABNORMAL HIGH (ref 0.00–0.50)

## 2020-07-13 LAB — MAGNESIUM: Magnesium: 2.1 mg/dL (ref 1.7–2.4)

## 2020-07-13 MED ORDER — PROPOFOL 1000 MG/100ML IV EMUL
INTRAVENOUS | Status: AC
  Filled 2020-07-13: qty 100

## 2020-07-13 MED ORDER — SODIUM CHLORIDE 0.9 % IV SOLN
2.0000 g | Freq: Three times a day (TID) | INTRAVENOUS | Status: DC
  Administered 2020-07-13 – 2020-07-16 (×10): 2 g via INTRAVENOUS
  Filled 2020-07-13 (×13): qty 2

## 2020-07-13 MED ORDER — ORAL CARE MOUTH RINSE
15.0000 mL | OROMUCOSAL | Status: DC
  Administered 2020-07-14 – 2020-07-25 (×116): 15 mL via OROMUCOSAL

## 2020-07-13 MED ORDER — FENTANYL CITRATE (PF) 100 MCG/2ML IJ SOLN: 50.0000 ug | INTRAMUSCULAR | Status: DC | PRN

## 2020-07-13 MED ORDER — CHLORHEXIDINE GLUCONATE 0.12% ORAL RINSE (MEDLINE KIT)
15.0000 mL | Freq: Two times a day (BID) | OROMUCOSAL | Status: DC
  Administered 2020-07-13 – 2020-07-25 (×24): 15 mL via OROMUCOSAL

## 2020-07-13 MED ORDER — CHLORHEXIDINE GLUCONATE CLOTH 2 % EX PADS
6.0000 | MEDICATED_PAD | Freq: Every day | CUTANEOUS | Status: DC
  Administered 2020-07-13 – 2020-07-20 (×6): 6 via TOPICAL

## 2020-07-13 MED ORDER — POLYETHYLENE GLYCOL 3350 17 G PO PACK: 17.0000 g | PACK | Freq: Every day | ORAL | Status: DC

## 2020-07-13 MED ORDER — DOCUSATE SODIUM 50 MG/5ML PO LIQD
100.0000 mg | Freq: Two times a day (BID) | ORAL | Status: DC
  Filled 2020-07-13 (×2): qty 10

## 2020-07-13 MED ORDER — LACTATED RINGERS IV BOLUS
1000.0000 mL | Freq: Once | INTRAVENOUS | Status: AC
  Administered 2020-07-13: 1000 mL via INTRAVENOUS

## 2020-07-13 MED ORDER — VANCOMYCIN HCL 1250 MG/250ML IV SOLN
1250.0000 mg | Freq: Once | INTRAVENOUS | Status: AC
  Administered 2020-07-13: 1250 mg via INTRAVENOUS
  Filled 2020-07-13: qty 250

## 2020-07-13 MED ORDER — PROPOFOL 1000 MG/100ML IV EMUL: 0.0000 ug/kg/min | INTRAVENOUS | Status: DC

## 2020-07-13 MED ORDER — VANCOMYCIN HCL 750 MG/150ML IV SOLN
750.0000 mg | Freq: Three times a day (TID) | INTRAVENOUS | Status: DC
  Administered 2020-07-13 – 2020-07-14 (×2): 750 mg via INTRAVENOUS
  Filled 2020-07-13 (×3): qty 150

## 2020-07-13 MED ORDER — PROPOFOL 1000 MG/100ML IV EMUL
INTRAVENOUS | Status: AC
  Administered 2020-07-13: 30 ug/kg/min
  Filled 2020-07-13: qty 100
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
## 2020-07-13 NOTE — Progress Notes (Signed)
EEG complete - results pending 
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
## 2020-07-13 NOTE — Progress Notes (Signed)
RT note: RT and RN transported vent patient to CT and back to ED. Vital signs stable through out. 
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
## 2020-07-13 NOTE — ED Notes (Signed)
Please contact Detective Laural Benes with [employer] is pt expires [phone]
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
## 2020-07-13 NOTE — ED Notes (Signed)
Bair hugger removed  

## 2020-07-13 NOTE — ED Notes (Signed)
Bare hugger removed from pt

## 2020-07-13 NOTE — Progress Notes (Signed)
Pharmacy Antibiotic Note  Emily [name] is a 44 y.o. female admitted on 08-02-20 with pneumonia.  Pharmacy has been consulted for vancomycin/cefepime dosing. Afebrile, WBC 18.1, LA 1.2. SCr 0.59 (at baseline) on admit.  Plan: Cefepime 2g IV q8h Vancomycin 1250mg  IV x1; then Vancomycin 750 mg IV Q 8 hrs Monitor clinical progress, c/s, renal function F/u de-escalation plan/LOT, vancomycin levels as indicated   Height: 5\' 8"  (172.7 cm) Weight: 66.5 kg (146 lb 9.7 oz) IBW/kg (Calculated) : 63.9  Temp (24hrs), Avg:96.3 F (35.7 C), Min:89.3 F (31.8 C), Max:98.4 F (36.9 C)  Recent Labs  Lab Aug 02, 2020 1943 08/02/20 1950 07/13/20 0025 07/13/20 0532  WBC 11.9*  --   --  18.1*  CREATININE 0.63 0.60  --  0.59  LATICACIDVEN  --   --  1.2  --     Estimated Creatinine Clearance: 91.5 mL/min (by C-G formula based on SCr of 0.59 mg/dL).    Allergies  Allergen Reactions  . Latex Itching    Latex condoms   . Penicillins Anaphylaxis    Has patient had a PCN reaction causing immediate rash, facial/tongue/throat swelling, SOB or lightheadedness with hypotension: No Has patient had a PCN reaction causing severe rash involving mucus membranes or skin necrosis: No Has patient had a PCN reaction that required hospitalization: No Has patient had a PCN reaction occurring within the last 10 years: No If all of the above answers are "NO", then may proceed with Cephalosporin use.    Antimicrobials this admission: 8/29 vancomycin >>  8/29 cefepime >>   Dose adjustments this admission:   Microbiology results:   9/29, PharmD, BCPS Please check AMION for all [facility] contact numbers Clinical Pharmacist 07/13/2020 8:26 AM

## 2020-07-14 ENCOUNTER — Inpatient Hospital Stay (HOSPITAL_COMMUNITY): Payer: Self-pay

## 2020-07-14 LAB — CBC
HCT: 37.1 % (ref 36.0–46.0)
Hemoglobin: 12.5 g/dL (ref 12.0–15.0)
MCH: 33.3 pg (ref 26.0–34.0)
MCHC: 33.7 g/dL (ref 30.0–36.0)
MCV: 98.9 fL (ref 80.0–100.0)
Platelets: 294 10*3/uL (ref 150–400)
RBC: 3.75 MIL/uL — ABNORMAL LOW (ref 3.87–5.11)
RDW: 13.7 % (ref 11.5–15.5)
WBC: 26.9 10*3/uL — ABNORMAL HIGH (ref 4.0–10.5)
nRBC: 0 % (ref 0.0–0.2)

## 2020-07-14 LAB — PHOSPHORUS
Phosphorus: 3.2 mg/dL (ref 2.5–4.6)
Phosphorus: 2.7 mg/dL (ref 2.5–4.6)

## 2020-07-14 LAB — BASIC METABOLIC PANEL
Anion gap: 14 (ref 5–15)
BUN: 15 mg/dL (ref 6–20)
CO2: 21 mmol/L — ABNORMAL LOW (ref 22–32)
Calcium: 8.5 mg/dL — ABNORMAL LOW (ref 8.9–10.3)
Chloride: 109 mmol/L (ref 98–111)
Creatinine, Ser: 0.7 mg/dL (ref 0.44–1.00)
GFR calc Af Amer: >60 mL/min (ref >60)
GFR calc non Af Amer: >60 mL/min (ref >60)
Glucose, Bld: 89 mg/dL (ref 70–99)
Potassium: 3 mmol/L — ABNORMAL LOW (ref 3.5–5.1)
Sodium: 144 mmol/L (ref 135–145)

## 2020-07-14 LAB — GLUCOSE, CAPILLARY: Glucose-Capillary: 119 mg/dL — ABNORMAL HIGH (ref 70–99)

## 2020-07-14 LAB — MAGNESIUM
Magnesium: 2.1 mg/dL (ref 1.7–2.4)
Magnesium: 2.1 mg/dL (ref 1.7–2.4)

## 2020-07-14 LAB — MRSA PCR SCREENING: MRSA by PCR: NEGATIVE

## 2020-07-14 MED ORDER — ADULT MULTIVITAMIN LIQUID CH
15.0000 mL | Freq: Every day | ORAL | Status: DC
  Administered 2020-07-15: 15 mL
  Filled 2020-07-14 (×3): qty 15

## 2020-07-14 MED ORDER — MIDAZOLAM HCL 2 MG/2ML IJ SOLN
4.0000 mg | Freq: Once | INTRAMUSCULAR | Status: AC
  Administered 2020-07-15: 4 mg via INTRAVENOUS
  Filled 2020-07-14: qty 4

## 2020-07-14 MED ORDER — ACETAMINOPHEN 325 MG PO TABS
650.0000 mg | ORAL_TABLET | ORAL | Status: DC | PRN
  Administered 2020-07-14 – 2020-07-20 (×14): 650 mg
  Filled 2020-07-14 (×16): qty 2

## 2020-07-14 MED ORDER — FENTANYL CITRATE (PF) 100 MCG/2ML IJ SOLN
200.0000 ug | Freq: Once | INTRAMUSCULAR | Status: AC
  Administered 2020-07-15: 200 ug via INTRAVENOUS
  Filled 2020-07-14: qty 4

## 2020-07-14 MED ORDER — VITAL HIGH PROTEIN PO LIQD: 1000.0000 mL | ORAL | Status: DC

## 2020-07-14 MED ORDER — VECURONIUM BROMIDE 10 MG IV SOLR
10.0000 mg | Freq: Once | INTRAVENOUS | Status: AC
  Administered 2020-07-15: 10 mg via INTRAVENOUS
  Filled 2020-07-14: qty 10

## 2020-07-14 MED ORDER — VITAL AF 1.2 CAL PO LIQD
1000.0000 mL | ORAL | Status: DC
  Administered 2020-07-14 – 2020-07-17 (×3): 1000 mL

## 2020-07-14 MED ORDER — SODIUM CHLORIDE 0.9 % IV BOLUS
500.0000 mL | Freq: Once | INTRAVENOUS | Status: AC
  Administered 2020-07-14: 500 mL via INTRAVENOUS

## 2020-07-14 MED ORDER — POLYETHYLENE GLYCOL 3350 17 G PO PACK
17.0000 g | PACK | Freq: Every day | ORAL | Status: DC
  Administered 2020-07-14 – 2020-07-18 (×4): 17 g
  Filled 2020-07-14 (×5): qty 1

## 2020-07-14 MED ORDER — VITAL AF 1.2 CAL PO LIQD
1000.0000 mL | ORAL | Status: DC
  Administered 2020-07-14: 1000 mL

## 2020-07-14 MED ORDER — PROSOURCE TF PO LIQD
45.0000 mL | Freq: Two times a day (BID) | ORAL | Status: DC
  Administered 2020-07-14: 45 mL
  Filled 2020-07-14: qty 45

## 2020-07-14 MED ORDER — POLYETHYLENE GLYCOL 3350 17 G PO PACK
17.0000 g | PACK | Freq: Every day | ORAL | Status: DC | PRN
  Administered 2020-07-16 – 2020-07-18 (×2): 17 g
  Filled 2020-07-14: qty 1

## 2020-07-14 MED ORDER — DOCUSATE SODIUM 50 MG/5ML PO LIQD
100.0000 mg | Freq: Two times a day (BID) | ORAL | Status: DC
  Administered 2020-07-14 – 2020-07-21 (×13): 100 mg
  Filled 2020-07-14 (×14): qty 10
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
## 2020-07-14 NOTE — Progress Notes (Signed)
RT transported patient from 4N25 to MRI and back with RN. No complications. RT pulled back ETT 2cm per MD order. ETT was 25cm @ the lips, ETT is now 23cm @ the lips. RT will continue to monitor.
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
## 2020-07-15 ENCOUNTER — Inpatient Hospital Stay (HOSPITAL_COMMUNITY): Payer: Self-pay

## 2020-07-15 DIAGNOSIS — J9809 Other diseases of bronchus, not elsewhere classified: Secondary | ICD-10-CM

## 2020-07-15 LAB — CULTURE, RESPIRATORY W GRAM STAIN

## 2020-07-15 LAB — BODY FLUID CELL COUNT WITH DIFFERENTIAL
Eos, Fluid: 0 %
Lymphs, Fluid: 1 %
Monocyte-Macrophage-Serous Fluid: 3 % — ABNORMAL LOW (ref 50–90)
Neutrophil Count, Fluid: 96 % — ABNORMAL HIGH (ref 0–25)
Total Nucleated Cell Count, Fluid: 17250 cu mm — ABNORMAL HIGH (ref 0–1000)

## 2020-07-15 LAB — PROTIME-INR
INR: 1.2 (ref 0.8–1.2)
Prothrombin Time: 14.5 seconds (ref 11.4–15.2)

## 2020-07-15 LAB — GLUCOSE, CAPILLARY
Glucose-Capillary: 101 mg/dL — ABNORMAL HIGH (ref 70–99)
Glucose-Capillary: 103 mg/dL — ABNORMAL HIGH (ref 70–99)
Glucose-Capillary: 106 mg/dL — ABNORMAL HIGH (ref 70–99)
Glucose-Capillary: 107 mg/dL — ABNORMAL HIGH (ref 70–99)
Glucose-Capillary: 108 mg/dL — ABNORMAL HIGH (ref 70–99)
Glucose-Capillary: 112 mg/dL — ABNORMAL HIGH (ref 70–99)
Glucose-Capillary: 113 mg/dL — ABNORMAL HIGH (ref 70–99)
Glucose-Capillary: 122 mg/dL — ABNORMAL HIGH (ref 70–99)
Glucose-Capillary: 142 mg/dL — ABNORMAL HIGH (ref 70–99)
Glucose-Capillary: 147 mg/dL — ABNORMAL HIGH (ref 70–99)

## 2020-07-15 LAB — MAGNESIUM
Magnesium: 2 mg/dL (ref 1.7–2.4)
Magnesium: 2.1 mg/dL (ref 1.7–2.4)

## 2020-07-15 LAB — PHOSPHORUS
Phosphorus: 2.5 mg/dL (ref 2.5–4.6)
Phosphorus: 3 mg/dL (ref 2.5–4.6)

## 2020-07-15 LAB — APTT: aPTT: 30 seconds (ref 24–36)

## 2020-07-15 MED ORDER — FENTANYL CITRATE (PF) 100 MCG/2ML IJ SOLN
200.0000 ug | Freq: Once | INTRAMUSCULAR | Status: AC
  Administered 2020-07-15: 200 ug via INTRAVENOUS

## 2020-07-15 MED ORDER — FENTANYL CITRATE (PF) 100 MCG/2ML IJ SOLN
INTRAMUSCULAR | Status: AC
  Filled 2020-07-15: qty 4

## 2020-07-15 MED ORDER — PANTOPRAZOLE SODIUM 40 MG PO PACK
40.0000 mg | PACK | Freq: Every day | ORAL | Status: DC
  Administered 2020-07-15 – 2020-07-21 (×7): 40 mg
  Filled 2020-07-15 (×7): qty 20

## 2020-07-15 MED ORDER — ACETAMINOPHEN 650 MG RE SUPP
650.0000 mg | RECTAL | Status: DC | PRN
  Administered 2020-07-15 – 2020-07-16 (×2): 650 mg via RECTAL
  Filled 2020-07-15 (×3): qty 1

## 2020-07-15 NOTE — Procedures (Signed)
Bedside Tracheostomy Insertion Procedure Note   Patient Details:   Name: Emily [name] DOB: 09/30/1976 MRN: [id number]  Procedure: Tracheostomy  Pre Procedure Assessment: ET Tube Size: 7.5 ET Tube secured at lip (cm): 23 Bite block in place: No Breath Sounds: Rhonch  Post Procedure Assessment: BP (!) 143/98    Pulse 94    Temp (!) 100.4 F (38 C)    Resp 15    Ht 5\' 8"  (1.727 m)    Wt 59.7 kg    SpO2 98%    BMI 20.01 kg/m  O2 sats: stable throughout Complications: No apparent complications Patient did tolerate procedure well Tracheostomy Brand:Shiley Tracheostomy Style:Cuffed Tracheostomy Size: 6 Tracheostomy Secured , velcro Tracheostomy Placement Confirmation:Trach cuff visualized and in place and Chest X ray ordered for placement    HUT:[id number] 07/15/2020, 3:03 PM

## 2020-07-15 NOTE — Progress Notes (Signed)
SLP Cancellation Note  Patient Details Name: Emily [name] MRN: [id number] DOB: 13-Jan-1976   Cancelled treatment:         Patient with new tracheostomy. Orders for SLP eval and treat for PMSV and swallowing received. Will follow pt closely for readiness for SLP interventions as appropriate.     Mahala Menghini., M.A. CCC-SLP Acute Rehabilitation Services Pager [phone] Office [phone]  07/15/2020, 2:44 PM

## 2020-07-15 NOTE — Procedures (Signed)
   Emily [name]  [id number]  10/12/1976  Date:07/15/20  Time:2:35 PM   Provider Performing:Derold Dorsch C Zorian Gunderman   Indication(s) Assist with direct visualization of tracheostomy placement  Consent Risks of the procedure as well as the alternatives and risks of each were explained to the patient and/or caregiver.  Consent for the procedure was obtained.   Anesthesia See separate tracheostomy note   Time Out Verified patient identification, verified procedure, site/side was marked, verified correct patient position, special equipment/implants available, medications/allergies/relevant history reviewed, required imaging and test results available.   Sterile Technique Usual hand hygiene, masks, gowns, and gloves were used   Procedure Description Bronchoscope advanced through endotracheal tube and into airway.  After suctioning out tracheal secretions, bronchoscope used to provide direct visualization of tracheostomy placement.  Prior to tracheostomy placement, therapeutic suctioning of tracheobronchial tree conducted and BAL performed in LUL.   Complications/Tolerance None; patient tolerated the procedure well.   EBL None  Specimen(s) None

## 2020-07-15 NOTE — TOC CAGE-AID Note (Signed)
Transition of Care [facility]) - CAGE-AID Screening   Patient Details  Name: Emily [name] MRN: [id number] Date of Birth: 09-26-1976  Transition of Care [facility]) CM/SW Contact:    Jimmy Picket, [place] Phone Number: 07/15/2020, 1:22 PM   Clinical Narrative: PT is unable to participate in assessment due to being on ventilator.    CAGE-AID Screening: Substance Abuse Screening unable to be completed due to: : Patient unable to participate                  Isabella Stalling Clinical Social Worker [phone]

## 2020-07-15 NOTE — Procedures (Signed)
Percutaneous Tracheostomy Procedure Note   Emily [name]  [id number]  June 15, 1976  Date:07/15/20  Time:2:45 PM   Provider Performing:Shilo Philipson  Procedure: Percutaneous Tracheostomy with Bronchoscopic Guidance (09470)  Indication(s) Prolonged mechanical ventilation.   Consent Risks of the procedure as well as the alternatives and risks of each were explained to the patient and/or caregiver.  Consent for the procedure was obtained.  Anesthesia  Versed, Fentanyl, Vecuronium   Time Out Verified patient identification, verified procedure, site/side was marked, verified correct patient position, special equipment/implants available, medications/allergies/relevant history reviewed, required imaging and test results available.   Sterile Technique Maximal sterile technique including sterile barrier drape, hand hygiene, sterile gown, sterile gloves, mask, hair covering.    Procedure Description Appropriate anatomy identified by palpation.  Patient's neck prepped and draped in sterile fashion.  1% lidocaine with epinephrine was used to anesthetize skin overlying neck.  1.5cm incision made and blunt dissection performed until tracheal rings could be easily palpated.   Then a size 6 Shiley tracheostomy was placed under bronchoscopic visualization using usual Seldinger technique and serial dilation.   Bronchoscope confirmed placement above the carina.  Tracheostomy was sutured in place with adhesive pad to protect skin under pressure.    Patient connected to ventilator.   Complications/Tolerance None; patient tolerated the procedure well. Chest X-ray is ordered to confirm no post-procedural complication.   EBL Minimal   Specimen(s) Sputum sent for culture  Lynnell Catalan, MD [facility] ICU Physician [facility]  Pager: [phone] Mobile: [phone] After hours: [phone].  07/15/2020, 2:46 PM

## 2020-07-16 ENCOUNTER — Inpatient Hospital Stay (HOSPITAL_COMMUNITY): Payer: Self-pay

## 2020-07-16 DIAGNOSIS — J9601 Acute respiratory failure with hypoxia: Secondary | ICD-10-CM

## 2020-07-16 DIAGNOSIS — I609 Nontraumatic subarachnoid hemorrhage, unspecified: Secondary | ICD-10-CM

## 2020-07-16 LAB — GLUCOSE, CAPILLARY
Glucose-Capillary: 100 mg/dL — ABNORMAL HIGH (ref 70–99)
Glucose-Capillary: 101 mg/dL — ABNORMAL HIGH (ref 70–99)
Glucose-Capillary: 108 mg/dL — ABNORMAL HIGH (ref 70–99)
Glucose-Capillary: 108 mg/dL — ABNORMAL HIGH (ref 70–99)
Glucose-Capillary: 110 mg/dL — ABNORMAL HIGH (ref 70–99)
Glucose-Capillary: 112 mg/dL — ABNORMAL HIGH (ref 70–99)
Glucose-Capillary: 125 mg/dL — ABNORMAL HIGH (ref 70–99)
Glucose-Capillary: 97 mg/dL (ref 70–99)

## 2020-07-16 MED ORDER — SODIUM CHLORIDE 0.9 % IV SOLN: 2.0000 g | INTRAVENOUS | Status: DC

## 2020-07-16 MED ORDER — SODIUM CHLORIDE 0.9 % IV SOLN
2.0000 g | INTRAVENOUS | Status: DC
  Administered 2020-07-16 – 2020-07-19 (×4): 2 g via INTRAVENOUS
  Filled 2020-07-16 (×4): qty 2

## 2020-07-16 MED ORDER — LEVETIRACETAM IN NACL 1000 MG/100ML IV SOLN
1000.0000 mg | Freq: Two times a day (BID) | INTRAVENOUS | Status: DC
  Administered 2020-07-16 – 2020-07-25 (×19): 1000 mg via INTRAVENOUS
  Filled 2020-07-16 (×20): qty 100

## 2020-07-16 NOTE — Procedures (Signed)
Cortrak  Person Inserting Tube:  Ersel Enslin M, RD Tube Type:  Cortrak - 43 inches Tube Location:  Right nare Initial Placement:  Stomach Secured by: Bridle Technique Used to Measure Tube Placement:  Documented cm marking at nare/ corner of mouth Cortrak Secured At:  68 cm Procedure Comments:  Cortrak Tube Team Note:  Consult received to place a Cortrak feeding tube.   No x-ray is required. RN may begin using tube.   If the tube becomes dislodged please keep the tube and contact the Cortrak team at [email or web address] (password [email or web address]) for replacement.  If after hours and replacement cannot be delayed, place a NG tube and confirm placement with an abdominal x-ray.       Hodari Chuba, MS, RD, LDN, CNSC Inpatient Clinical Dietitian RD pager # available in AMION  After hours/weekend pager # available in AMION      
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
## 2020-07-16 NOTE — Evaluation (Signed)
Physical Therapy Evaluation Patient Details Name: Emily [name] MRN: [id number] DOB: 1976/05/01 Today's Date: 07/16/2020   History of Present Illness  44 y.o. F with  MS, schizoaffective disorder, depression who was brought in by EMS from home unresponsive after a "fall" two days ago.   Per EMS report, pt's boyfriend said patient fell down the stairs two days ago and has been laying in bed unresponsive ever since.  She was responsive only to pain and was intubated in the ED. CT head with multiple, acute scattered subarachnoid hemorrhages with subdural hygromas and 37mm L to R midline shift.  Per Neurosurgery, no indication for surgical intervention and recommended supportive care.  Further work-up revealed multiple displaced R rib fractures, L4 transverse process fracture and GGO's in bilateral lower lobes.  Pt was also found to be Covid-19+. Pt underwent tracheostomy on 07/15/2020.  Clinical Impression  Pt presents to PT with significant deficits in arousal, cognition, functional mobility, strength, power, tone, balance. Pt is obtunded throughout session, off all sedation and unable to be aroused. Pt demonstrates rooting reflex during session, no response to painful stimuli, and has increased tone in LLE and LUE. Pt remains obtunded with change in head position into bed egress position. Pt will benefit from continued acute PT POC to improve arousal and participation as well as mobility quality.    Follow Up Recommendations SNF;LTACH (SNF vs LTACH pending medical necessity)    Equipment Recommendations  Wheelchair (measurements PT);Wheelchair cushion (measurements PT);Hospital bed (mechanical lift, all if home today)    Recommendations for Other Services       Precautions / Restrictions Precautions Precautions: Fall Precaution Comments: systolic BP <160 Restrictions Weight Bearing Restrictions: No      Mobility  Bed Mobility Overal bed mobility: Needs Assistance              General bed mobility comments: pt assisted into bed egress position, otherwise mobility deferred 2/2 pt being obtunded and unable to participate at this time  Transfers                    Ambulation/Gait                Stairs            Wheelchair Mobility    Modified Rankin (Stroke Patients Only) Modified Rankin (Stroke Patients Only) Pre-Morbid Rankin Score: No symptoms Modified Rankin: Severe disability     Balance                                             Pertinent Vitals/Pain Pain Assessment: Faces Pain Score: 0-No pain Pain Location: pt does not grimace during any mobility, no reaction to painful stimuli    Home Living                   Additional Comments: unable to determine, pt unable to communicate at this time. no family present    Prior Function           Comments: unable to determine, pt unable to communicate at this time. no family present     Hand Dominance        Extremity/Trunk Assessment   Upper Extremity Assessment Upper Extremity Assessment: RUE deficits/detail;LUE deficits/detail RUE Deficits / Details: PROM WFL, no AROM noted RUE Sensation:  (no response to painful stimuli) LUE Deficits / Details:  PROM WFL, pt with increased elbow flexor tone noted LUE Sensation:  (no response to painful stimuli)    Lower Extremity Assessment Lower Extremity Assessment: RLE deficits/detail;LLE deficits/detail RLE Deficits / Details: PROM WFL, no AROM noted RLE Sensation:  (no response to painful stimulation) LLE Deficits / Details: PROM WFL, increased knee extensor tone, no AROM noted LLE Sensation:  (no response to painful stimuli)    Cervical / Trunk Assessment Cervical / Trunk Assessment: Normal  Communication   Communication: Tracheostomy  Cognition Arousal/Alertness: Lethargic Behavior During Therapy: Flat affect Overall Cognitive Status: Difficult to assess                                         General Comments General comments (skin integrity, edema, etc.): pt demonstrates rooting reflex bilaterally, no reaction of pupils at this time, pt on trach to vent, VSS during session    Exercises Other Exercises Other Exercises: PROM in BUE, elbow flexion/extension, shoulder flexion/extension 5 reps Other Exercises: bilateral LE PROM ankle PF/DF, knee flexion/extension, hip flexion/extension   Assessment/Plan    PT Assessment Patient needs continued PT services  PT Problem List Decreased strength;Decreased activity tolerance;Decreased balance;Decreased mobility;Decreased cognition;Cardiopulmonary status limiting activity;Impaired sensation;Impaired tone       PT Treatment Interventions Functional mobility training;Therapeutic activities;Therapeutic exercise;Balance training;Neuromuscular re-education;Cognitive remediation;Patient/family education;Wheelchair mobility training    PT Goals (Current goals can be found in the Care Plan section)  Acute Rehab PT Goals Patient Stated Goal: pt unable to state, PT goal to improve arousal and strength PT Goal Formulation: Patient unable to participate in goal setting Time For Goal Achievement: 07/30/20 Potential to Achieve Goals: Poor    Frequency Min 2X/week   Barriers to discharge        Co-evaluation               AM-PAC PT "6 Clicks" Mobility  Outcome Measure Help needed turning from your back to your side while in a flat bed without using bedrails?: Total Help needed moving from lying on your back to sitting on the side of a flat bed without using bedrails?: Total Help needed moving to and from a bed to a chair (including a wheelchair)?: Total Help needed standing up from a chair using your arms (e.g., wheelchair or bedside chair)?: Total Help needed to walk in hospital room?: Total Help needed climbing 3-5 steps with a railing? : Total 6 Click Score: 6    End of Session Equipment Utilized  During Treatment: Oxygen Activity Tolerance: Patient limited by lethargy Patient left: in bed;with call bell/phone within reach;with bed alarm set Nurse Communication: Mobility status;Need for lift equipment PT Visit Diagnosis: Other abnormalities of gait and mobility (R26.89);Other symptoms and signs involving the nervous system (R29.898);History of falling (Z91.81)    Time: 0786-7544 PT Time Calculation (min) (ACUTE ONLY): 29 min   Charges:   PT Evaluation $PT Eval High Complexity: 1 High          Arlyss Gandy, PT, DPT Acute Rehabilitation Pager: [phone]   Arlyss Gandy 07/16/2020, 4:03 PM

## 2020-07-16 NOTE — Progress Notes (Signed)
PCCM INTERVAL PROGRESS NOTE   Sputum culture resulted with few HAEMOPHILUS INFLUENZAE beta lactamase negative.   Anitbiotics narrowed to ceftriaxone.     Emily [name], AGACNP-BC [facility]  See Amion for personal pager PCCM on call pager [phone]  07/16/2020 5:51 PM

## 2020-07-16 NOTE — Progress Notes (Signed)
vLTM EEG running. Notified neuro 

## 2020-07-16 DEATH — deceased

## 2020-07-17 DIAGNOSIS — J9600 Acute respiratory failure, unspecified whether with hypoxia or hypercapnia: Secondary | ICD-10-CM

## 2020-07-17 LAB — COMPREHENSIVE METABOLIC PANEL
ALT: 46 U/L — ABNORMAL HIGH (ref 0–44)
AST: 38 U/L (ref 15–41)
Albumin: 3.1 g/dL — ABNORMAL LOW (ref 3.5–5.0)
Alkaline Phosphatase: 124 U/L (ref 38–126)
Anion gap: 10 (ref 5–15)
BUN: 13 mg/dL (ref 6–20)
CO2: 24 mmol/L (ref 22–32)
Calcium: 9.1 mg/dL (ref 8.9–10.3)
Chloride: 108 mmol/L (ref 98–111)
Creatinine, Ser: 0.63 mg/dL (ref 0.44–1.00)
GFR calc Af Amer: >60 mL/min (ref >60)
GFR calc non Af Amer: >60 mL/min (ref >60)
Glucose, Bld: 127 mg/dL — ABNORMAL HIGH (ref 70–99)
Potassium: 3.3 mmol/L — ABNORMAL LOW (ref 3.5–5.1)
Sodium: 142 mmol/L (ref 135–145)
Total Bilirubin: 0.9 mg/dL (ref 0.3–1.2)
Total Protein: 6.9 g/dL (ref 6.5–8.1)

## 2020-07-17 LAB — CBC
HCT: 37.5 % (ref 36.0–46.0)
Hemoglobin: 12.7 g/dL (ref 12.0–15.0)
MCH: 33.3 pg (ref 26.0–34.0)
MCHC: 33.9 g/dL (ref 30.0–36.0)
MCV: 98.4 fL (ref 80.0–100.0)
Platelets: 334 10*3/uL (ref 150–400)
RBC: 3.81 MIL/uL — ABNORMAL LOW (ref 3.87–5.11)
RDW: 13.2 % (ref 11.5–15.5)
WBC: 16.3 10*3/uL — ABNORMAL HIGH (ref 4.0–10.5)
nRBC: 0 % (ref 0.0–0.2)

## 2020-07-17 LAB — CULTURE, RESPIRATORY W GRAM STAIN: Culture: NO GROWTH

## 2020-07-17 LAB — D-DIMER, QUANTITATIVE: D-Dimer, Quant: 7.33 ug/mL-FEU — ABNORMAL HIGH (ref 0.00–0.50)

## 2020-07-17 LAB — GLUCOSE, CAPILLARY
Glucose-Capillary: 121 mg/dL — ABNORMAL HIGH (ref 70–99)
Glucose-Capillary: 120 mg/dL — ABNORMAL HIGH (ref 70–99)
Glucose-Capillary: 115 mg/dL — ABNORMAL HIGH (ref 70–99)
Glucose-Capillary: 128 mg/dL — ABNORMAL HIGH (ref 70–99)
Glucose-Capillary: 112 mg/dL — ABNORMAL HIGH (ref 70–99)

## 2020-07-17 MED ORDER — POTASSIUM CHLORIDE 20 MEQ/15ML (10%) PO SOLN
40.0000 meq | Freq: Once | ORAL | Status: AC
  Administered 2020-07-17: 40 meq
  Filled 2020-07-17: qty 30

## 2020-07-17 MED ORDER — JEVITY 1.2 CAL PO LIQD
1000.0000 mL | ORAL | Status: DC
  Administered 2020-07-17 – 2020-07-21 (×5): 1000 mL
  Filled 2020-07-17 (×8): qty 1000

## 2020-07-17 MED ORDER — PROSOURCE TF PO LIQD
45.0000 mL | Freq: Every day | ORAL | Status: DC
  Administered 2020-07-17 – 2020-07-21 (×5): 45 mL
  Filled 2020-07-17 (×5): qty 45

## 2020-07-17 MED ORDER — ADULT MULTIVITAMIN W/MINERALS CH
1.0000 | ORAL_TABLET | Freq: Every day | ORAL | Status: DC
  Administered 2020-07-18 – 2020-07-21 (×4): 1
  Filled 2020-07-17 (×4): qty 1

## 2020-07-17 NOTE — Progress Notes (Signed)
EEG maintenance done. No skin breakdown at FP1 Fp2 A2

## 2020-07-17 NOTE — Consult Note (Signed)
Neurology Consultation  Reason for Consult: Not waking up after TBI Referring Physician: Byrum  CC: not waking up after TBI  History is obtained from: chart  HPI: Emily [name] is a 45 y.o. female with history of MS, MDD and anxiety.  Patient was admitted with unresponsiveness after recent fall 2 days ago with subarachnoid hemorrhage and subdural hematoma.  Per EMS, patient's boyfriend said patient fell down the stairs 2 days ago and has been laying in bed unresponsive ever since.  She was only responsive to pain and thus intubated in the ED.  On arrival UDS was only positive for THC.  Patient's lithium level was therapeutic.  On exam in the ED CT showed acute scattered subarachnoid hemorrhage with subdural hygromas and 2 mm left to right midline shift.  Neurosurgery was consulted but did not indicate for any surgical intervention at that time.  Patient has been in the ICU off of any form of sedation for the last 2 days.  She has been in the ICU for a total of 4 days.  Due to the fact that patient is not waking up and/or making any noticeable recovery neurology was asked to see the patient to evaluate.  Patient was placed on Keppra 1000 mg twice daily and  was placed on continuous EEG which did not show any epileptiform activity.    MRI was obtained on 07/14/2020 which showed extensive restrictive diffusion involving the parasagittal left greater than the right frontal lobes, high left parietal lobe, anteromedial left temporal lobe/hippocampus possibly the right midbrain.  These findings were consistent with traumatic cortical contusion/infarcts given the patient's clinical history.  Similar right greater than left subdural collections, 12 mm on the right and 8 mm on the left.  Extensive age advanced white matter signal abnormality with the brain atrophy, grossly similar to prior MRI in April 9.   Past Medical History:  Diagnosis Date  . Anxiety   . MDD (major depressive disorder)   . MS  (multiple sclerosis) ([place])     Family History  Family history unknown: Yes   Social History:   reports that she has quit smoking. She has never used smokeless tobacco. She reports current alcohol use. She reports current drug use. Drug: Marijuana.  Medications  Current Facility-Administered Medications:  .  acetaminophen (TYLENOL) suppository 650 mg, 650 mg, Rectal, Q4H PRN, Blenda Nicely, RPH, 650 mg at 07/16/20 4008 .  acetaminophen (TYLENOL) tablet 650 mg, 650 mg, Per Tube, Q4H PRN, Kipp Brood, MD, 650 mg at 07/17/20 1213 .  cefTRIAXone (ROCEPHIN) 2 g in sodium chloride 0.9 % 100 mL IVPB, 2 g, Intravenous, Q24H, Corey Harold, NP, Stopped at 07/16/20 1822 .  chlorhexidine gluconate (MEDLINE KIT) (PERIDEX) 0.12 % solution 15 mL, 15 mL, Mouth Rinse, BID, Jonnie Finner, Michele Mcalpine, MD, 15 mL at 07/17/20 0821 .  Chlorhexidine Gluconate Cloth 2 % PADS 6 each, 6 each, Topical, Daily, Bennie Pierini, MD, 6 each at 07/16/20 0345 .  docusate (COLACE) 50 MG/5ML liquid 100 mg, 100 mg, Per Tube, BID, Jonnie Finner, Michele Mcalpine, MD, 100 mg at 07/17/20 1151 .  docusate sodium (COLACE) capsule 100 mg, 100 mg, Oral, BID PRN, Gleason, Otilio Carpen, PA-C .  feeding supplement (JEVITY 1.2 CAL) liquid 1,000 mL, 1,000 mL, Per Tube, Continuous, Byrum, Rose Fillers, MD, Last Rate: 60 mL/hr at 07/17/20 1345, 1,000 mL at 07/17/20 1345 .  feeding supplement (PROSource TF) liquid 45 mL, 45 mL, Per Tube, Daily, Byrum, Rose Fillers, MD, 45  mL at 07/17/20 1350 .  insulin aspart (novoLOG) injection 0-15 Units, 0-15 Units, Subcutaneous, Q4H, Gleason, Otilio Carpen, PA-C, 2 Units at 07/17/20 0306 .  levETIRAcetam (KEPPRA) IVPB 1000 mg/100 mL premix, 1,000 mg, Intravenous, Q12H, Corey Harold, NP, Last Rate: 400 mL/hr at 07/17/20 1345, 1,000 mg at 07/17/20 1345 .  MEDLINE mouth rinse, 15 mL, Mouth Rinse, 10 times per day, Bennie Pierini, MD, 15 mL at 07/17/20 1400 .  [START ON 07/18/2020] multivitamin with minerals tablet 1 tablet, 1  tablet, Per Tube, Daily, Byrum, Rose Fillers, MD .  pantoprazole sodium (PROTONIX) 40 mg/20 mL oral suspension 40 mg, 40 mg, Per Tube, Daily, Agarwala, Ravi, MD, 40 mg at 07/17/20 1151 .  polyethylene glycol (MIRALAX / GLYCOLAX) packet 17 g, 17 g, Per Tube, Daily PRN, Bennie Pierini, MD, 17 g at 07/16/20 1415 .  polyethylene glycol (MIRALAX / GLYCOLAX) packet 17 g, 17 g, Per Tube, Daily, Bennie Pierini, MD, 17 g at 07/17/20 1151 .  potassium chloride 20 MEQ/15ML (10%) solution 40 mEq, 40 mEq, Per Tube, Once, Corey Harold, NP  ROS: Unable to obtain due to altered mental status.    Exam: Current vital signs: BP (!) 145/88   Pulse (!) 118   Temp 99.8 F (37.7 C) (Axillary)   Resp (!) 22   Ht '5\' 8"'  (1.727 m)   Wt 59.9 kg   SpO2 100%   BMI 20.08 kg/m  Vital signs in last 24 hours: Temp:  [99.1 F (37.3 C)-101 F (38.3 C)] 99.8 F (37.7 C) (09/02 1300) Pulse Rate:  [88-118] 118 (09/02 1300) Resp:  [15-39] 22 (09/02 1300) BP: (131-164)/(86-114) 145/88 (09/02 1300) SpO2:  [100 %] 100 % (09/02 1300) FiO2 (%):  [40 %] 40 % (09/02 0836)   Constitutional: Appears well-developed and well-nourished.  Eyes: No scleral injection HENT: No OP obstrucion Head: Normocephalic.  Cardiovascular: Normal rate and regular rhythm.  Respiratory: Effort normal, non-labored breathing GI: Soft.  No distension. There is no tenderness.  Skin: WDI  Neuro: Mental Status: Patient does not respond to verbal stimuli.  Does not respond to deep sternal rub.  Does not follow commands.  No verbalizations noted.  Cranial Nerves: II: patient does not respond confrontation bilaterally,  III,IV,VI: doll's response present bilaterally. pupils right 4 mm, left 4 mm,and nonreactive bilaterally V,VII: corneal reflex present bilaterally  VIII: patient does not respond to verbal stimuli IX,X: gag reflex present, XI: trapezius strength unable to test bilaterally XII: tongue strength unable to  test Motor: Extremities flaccid throughout.  No spontaneous movement noted.  No purposeful movements noted. Sensory: Only responds noxious stimuli in the left upper extremity. Deep Tendon Reflexes:  1+ knee jerks, 2+ brachioradialis, no ankle jerks and or clonus Plantars: absent bilaterally Cerebellar: Unable to perform Labs I have reviewed labs in epic and the results pertinent to this consultation are:   CBC    Component Value Date/Time   WBC 16.3 (H) 07/17/2020 0829   RBC 3.81 (L) 07/17/2020 0829   HGB 12.7 07/17/2020 0829   HCT 37.5 07/17/2020 0829   PLT 334 07/17/2020 0829   MCV 98.4 07/17/2020 0829   MCH 33.3 07/17/2020 0829   MCHC 33.9 07/17/2020 0829   RDW 13.2 07/17/2020 0829   LYMPHSABS 1.6 02/23/2017 0553   MONOABS 0.6 02/23/2017 0553   EOSABS 1.4 (H) 02/23/2017 0553   BASOSABS 0.1 02/23/2017 0553    CMP     Component Value Date/Time   NA 142  07/17/2020 0829   K 3.3 (L) 07/17/2020 0829   CL 108 07/17/2020 0829   CO2 24 07/17/2020 0829   GLUCOSE 127 (H) 07/17/2020 0829   BUN 13 07/17/2020 0829   CREATININE 0.63 07/17/2020 0829   CALCIUM 9.1 07/17/2020 0829   PROT 6.9 07/17/2020 0829   ALBUMIN 3.1 (L) 07/17/2020 0829   AST 38 07/17/2020 0829   ALT 46 (H) 07/17/2020 0829   ALKPHOS 124 07/17/2020 0829   BILITOT 0.9 07/17/2020 0829   GFRNONAA >60 07/17/2020 0829   GFRAA >60 07/17/2020 0829    Lipid Panel     Component Value Date/Time   CHOL 152 10/26/2019 0630   TRIG 60 10/26/2019 0630   HDL 44 10/26/2019 0630   CHOLHDL 3.5 10/26/2019 0630   VLDL 12 10/26/2019 0630   LDLCALC 96 10/26/2019 0630     Imaging I have reviewed the images obtained:  CT-scan of the brain-New subdural hemorrhage layering posteriorly along the falx, measuring up to 7 mm in thickness. There is a small amount of blood layering along the tentorium, more so on the left. The more isodense bilateral subdural collections are unchanged. 2. Unchanged bilateral subarachnoid  hemorrhage involving both cerebral as well as likely left cerebellar hemisphere. 3. Unchanged 2 mm left-to-right midline shift.  MRI examination of the brain-extensive restrictive diffusion involving the parasagittal left greater than the right frontal lobes, high left parietal lobe, anteromedial left temporal lobe/hippocampus possibly the right midbrain.  These findings were consistent with traumatic cortical contusion/infarcts given the patient's clinical history.  Similar right greater than left subdural collections, 12 mm on the right and 8 mm on the left.  Extensive age advanced white matter signal abnormality with the brain atrophy, grossly similar to prior MRI in April 9.   Etta Quill PA-C [facility] [phone]  M-F  (9:00 am- 5:00 PM)  07/17/2020, 2:35 PM     Assessment:  This is a 44 year old female found after a traumatic fall down the stairs.  Patient was unresponsive for 2 days at which point EMS was called.  Patient has been hooked up to LTM which did not show any epileptiform activity.  MRI findings above.  Exam is positive for corneals, gag, doll's and only response to pain on the left upper extremity.  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  Unfortunately traumatic brain injuries can take significant time to return to baseline.  She will need serial neuro exams.  Given the fact that she has had no seizures through LTM, LTM will be discontinued however due to recommend keeping patient on current dose of Keppra.  Would not use glucocorticoid as these have been found to be harmful with patients with TBI's.  No further recommendations per neurology.

## 2020-07-17 NOTE — Progress Notes (Signed)
vLTM EEG complete. No skin breakdown 
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
## 2020-07-17 NOTE — Progress Notes (Signed)
SLP Cancellation Note  Patient Details Name: SUNITA DEMOND MRN: [id number] DOB: [birth year]   Cancelled treatment:       Reason Eval/Treat Not Completed: Patient not medically ready (intubated, not responsive per notes). Will continue to follow for readiness to try PMV.    Mahala Menghini., M.A. CCC-SLP Acute Rehabilitation Services Pager [phone] Office [phone]  07/17/2020, 7:27 AM

## 2020-07-17 NOTE — Procedures (Signed)
Patient Name: Emily [name]  MRN: [id number]  Epilepsy Attending: Charlsie Quest  Referring Physician/Provider: Joneen Roach, NP Duration:  07/16/2020 1427 to 07/17/2020 1030  Patient history: 44yo F with traumatic SAH and seizure-like activity.  EEG to evaluate for  seizures  Level of alertness: comatose  AEDs during EEG study:  Keppra  Technical aspects: This EEG study was done with scalp electrodes positioned according to the 10-20 International system of electrode placement. Electrical activity was acquired at a sampling rate of 500Hz  and reviewed with a high frequency filter of 70Hz  and a low frequency filter of 1Hz . EEG data were recorded continuously and digitally stored.   Description: EEG showed continuous generalized and maximal right temporal 3 to 6 Hz theta-delta slowing. Sharp waves were also noted in right fronto-temporal region.  Hyperventilation and photic stimulation were not performed.     ABNORMALITY -Continuous slow, generalized and maximal right temporal -Sharp wave, right frontotemporal region  IMPRESSION: This study showed evidence of potential epileptogenicity arising from right frontotemporal region as well as cortical dysfunction in right temporal region likely secondary to underlying SAH. Additionally, there is evidence of severe diffuse encephalopathy, nonspecific etiology but likely related to sedation. No seizures were seen throughout the recording.  [name] 

## 2020-07-18 ENCOUNTER — Inpatient Hospital Stay (HOSPITAL_COMMUNITY): Payer: Self-pay

## 2020-07-18 DIAGNOSIS — G9341 Metabolic encephalopathy: Secondary | ICD-10-CM

## 2020-07-18 DIAGNOSIS — J14 Pneumonia due to Hemophilus influenzae: Secondary | ICD-10-CM

## 2020-07-18 LAB — BASIC METABOLIC PANEL
Anion gap: 12 (ref 5–15)
BUN: 13 mg/dL (ref 6–20)
CO2: 21 mmol/L — ABNORMAL LOW (ref 22–32)
Calcium: 8.9 mg/dL (ref 8.9–10.3)
Chloride: 106 mmol/L (ref 98–111)
Creatinine, Ser: 0.52 mg/dL (ref 0.44–1.00)
GFR calc Af Amer: >60 mL/min (ref >60)
GFR calc non Af Amer: >60 mL/min (ref >60)
Glucose, Bld: 121 mg/dL — ABNORMAL HIGH (ref 70–99)
Potassium: 3.7 mmol/L (ref 3.5–5.1)
Sodium: 139 mmol/L (ref 135–145)

## 2020-07-18 LAB — URINALYSIS, ROUTINE W REFLEX MICROSCOPIC
Bilirubin Urine: NEGATIVE
Glucose, UA: NEGATIVE mg/dL
Hgb urine dipstick: NEGATIVE
Ketones, ur: NEGATIVE mg/dL
Leukocytes,Ua: NEGATIVE
Nitrite: NEGATIVE
Protein, ur: NEGATIVE mg/dL
Specific Gravity, Urine: 1.016 (ref 1.005–1.030)
pH: 7 (ref 5.0–8.0)

## 2020-07-18 LAB — CBC
HCT: 37.3 % (ref 36.0–46.0)
Hemoglobin: 12.2 g/dL (ref 12.0–15.0)
MCH: 32.9 pg (ref 26.0–34.0)
MCHC: 32.7 g/dL (ref 30.0–36.0)
MCV: 100.5 fL — ABNORMAL HIGH (ref 80.0–100.0)
Platelets: 391 10*3/uL (ref 150–400)
RBC: 3.71 MIL/uL — ABNORMAL LOW (ref 3.87–5.11)
RDW: 13.6 % (ref 11.5–15.5)
WBC: 18.8 10*3/uL — ABNORMAL HIGH (ref 4.0–10.5)
nRBC: 0 % (ref 0.0–0.2)

## 2020-07-18 LAB — GLUCOSE, CAPILLARY
Glucose-Capillary: 126 mg/dL — ABNORMAL HIGH (ref 70–99)
Glucose-Capillary: 132 mg/dL — ABNORMAL HIGH (ref 70–99)
Glucose-Capillary: 135 mg/dL — ABNORMAL HIGH (ref 70–99)
Glucose-Capillary: 143 mg/dL — ABNORMAL HIGH (ref 70–99)
Glucose-Capillary: 148 mg/dL — ABNORMAL HIGH (ref 70–99)
Glucose-Capillary: 149 mg/dL — ABNORMAL HIGH (ref 70–99)
Glucose-Capillary: 152 mg/dL — ABNORMAL HIGH (ref 70–99)

## 2020-07-18 MED ORDER — METOPROLOL TARTRATE 5 MG/5ML IV SOLN
INTRAVENOUS | Status: AC
  Filled 2020-07-18: qty 5

## 2020-07-18 MED ORDER — NICARDIPINE HCL IN NACL 20-0.86 MG/200ML-% IV SOLN
3.0000 mg/h | INTRAVENOUS | Status: DC
  Administered 2020-07-18: 12.5 mg/h via INTRAVENOUS
  Administered 2020-07-18: 10 mg/h via INTRAVENOUS
  Administered 2020-07-18: 5 mg/h via INTRAVENOUS
  Filled 2020-07-18: qty 200
  Filled 2020-07-18: qty 400
  Filled 2020-07-18: qty 200

## 2020-07-18 MED ORDER — ENOXAPARIN SODIUM 40 MG/0.4ML ~~LOC~~ SOLN: 40.0000 mg | SUBCUTANEOUS | Status: DC

## 2020-07-18 MED ORDER — FENTANYL CITRATE (PF) 100 MCG/2ML IJ SOLN
INTRAMUSCULAR | Status: AC
  Filled 2020-07-18: qty 2

## 2020-07-18 MED ORDER — ENOXAPARIN SODIUM 40 MG/0.4ML ~~LOC~~ SOLN
40.0000 mg | SUBCUTANEOUS | Status: DC
  Administered 2020-07-18 – 2020-07-21 (×4): 40 mg via SUBCUTANEOUS
  Filled 2020-07-18 (×4): qty 0.4

## 2020-07-18 MED ORDER — METOPROLOL TARTRATE 5 MG/5ML IV SOLN
2.5000 mg | INTRAVENOUS | Status: DC | PRN
  Administered 2020-07-18 – 2020-07-19 (×4): 5 mg via INTRAVENOUS
  Administered 2020-07-19 – 2020-07-24 (×2): 2.5 mg via INTRAVENOUS
  Filled 2020-07-18 (×5): qty 5

## 2020-07-18 MED ORDER — PROPRANOLOL HCL 20 MG/5ML PO SOLN
20.0000 mg | Freq: Three times a day (TID) | ORAL | Status: DC
  Administered 2020-07-18 – 2020-07-21 (×9): 20 mg
  Filled 2020-07-18 (×13): qty 5

## 2020-07-18 MED ORDER — HYDRALAZINE HCL 20 MG/ML IJ SOLN
10.0000 mg | INTRAMUSCULAR | Status: DC | PRN
  Administered 2020-07-18 – 2020-07-19 (×2): 10 mg via INTRAVENOUS
  Filled 2020-07-18 (×3): qty 1

## 2020-07-18 MED ORDER — POLYETHYLENE GLYCOL 3350 17 G PO PACK
17.0000 g | PACK | Freq: Two times a day (BID) | ORAL | Status: DC
  Administered 2020-07-18 – 2020-07-21 (×5): 17 g
  Filled 2020-07-18 (×5): qty 1

## 2020-07-18 MED ORDER — PROPRANOLOL HCL 20 MG/5ML PO SOLN
20.0000 mg | Freq: Three times a day (TID) | ORAL | Status: DC
  Filled 2020-07-18 (×2): qty 5

## 2020-07-18 MED ORDER — LACTATED RINGERS IV BOLUS
1000.0000 mL | Freq: Once | INTRAVENOUS | Status: AC
  Administered 2020-07-18: 1000 mL via INTRAVENOUS

## 2020-07-18 MED ORDER — FENTANYL CITRATE (PF) 100 MCG/2ML IJ SOLN
50.0000 ug | Freq: Once | INTRAMUSCULAR | Status: AC
  Administered 2020-07-18: 50 ug via INTRAVENOUS

## 2020-07-18 MED ORDER — SENNOSIDES 8.8 MG/5ML PO SYRP
5.0000 mL | ORAL_SOLUTION | Freq: Two times a day (BID) | ORAL | Status: DC
  Administered 2020-07-18 – 2020-07-21 (×7): 5 mL
  Filled 2020-07-18 (×5): qty 5

## 2020-07-18 NOTE — Progress Notes (Signed)
eLink Physician-Brief Progress Note Patient Name: Emily [name] DOB: [birth year] MRN: [id number]   Date of Service  07/18/2020  HPI/Events of Note  Head CT Scan: Unchanged left parasagittal cortex edema, subdural collections, and high-density blood products. HR = 159 Sinus Tachycardia.   eICU Interventions  Plan \: 1. Metoprolol 2.5-5 mg IV Q 3 hours PRN HR > 115.     Intervention Category Major Interventions: Other:  Drenda Sobecki Dennard Nip 07/18/2020, 6:26 AM
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
## 2020-07-18 NOTE — Progress Notes (Signed)
Pt's father Rosalio Macadamia [phone]) updated on plan of care over phone. He would like to be updated by MD each day.

## 2020-07-18 NOTE — Progress Notes (Signed)
Condition critical confirmed with Det Eula Fried [phone].

## 2020-07-18 NOTE — Plan of Care (Signed)
  Problem: Clinical Measurements: Goal: Will remain free from infection Outcome: Progressing Goal: Diagnostic test results will improve Outcome: Progressing Goal: Respiratory complications will improve Outcome: Progressing Goal: Cardiovascular complication will be avoided Outcome: Progressing   Problem: Nutrition: Goal: Adequate nutrition will be maintained Outcome: Progressing   Problem: Safety: Goal: Ability to remain free from injury will improve Outcome: Progressing   Problem: Skin Integrity: Goal: Risk for impaired skin integrity will decrease Outcome: Progressing   Problem: Respiratory: Goal: Will regain and/or maintain adequate ventilation Outcome: Progressing

## 2020-07-18 NOTE — Progress Notes (Signed)
eLink Physician-Brief Progress Note Patient Name: Emily [name] DOB: 10-31-1976 MRN: [id number]   Date of Service  07/18/2020  HPI/Events of Note  HTN - BP = 170/114 in spite of Hydralazine IV. Pupils fixed and dilated. Herniation?  eICU Interventions  Plan: 1. Nicardipine IV infusion. Titrate to SBP < 160. 2. Head CT Scan w/o contrast STAT.      Intervention Category Major Interventions: Hypertension - evaluation and management  [name] 07/18/2020, 4:39 AM

## 2020-07-18 NOTE — Progress Notes (Signed)
Updated E-link about patient's blood pressure being above 160 systolic and 100 diastolic. Awaiting any new orders. Will continue to monitor.

## 2020-07-18 NOTE — Progress Notes (Signed)
eLink Physician-Brief Progress Note Patient Name: Emily [name] DOB: Sep 03, 1976 MRN: [id number]   Date of Service  07/18/2020  HPI/Events of Note  HTN - BP = 165/108.   eICU Interventions  Plan: 1. Hydralazine 10 mg IV Q 4 hours PRN SBP > 160 or DBP > 100.     Intervention Category Major Interventions: Hypertension - evaluation and management  Khanh Cordner Eugene 07/18/2020, 3:01 AM

## 2020-07-18 NOTE — Progress Notes (Signed)
Pt tachycardic in 150s. MD Chand paged to notify. Lab also called to draw AM labs.
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
## 2020-07-19 LAB — BASIC METABOLIC PANEL
Anion gap: 12 (ref 5–15)
BUN: 11 mg/dL (ref 6–20)
CO2: 22 mmol/L (ref 22–32)
Calcium: 9.6 mg/dL (ref 8.9–10.3)
Chloride: 110 mmol/L (ref 98–111)
Creatinine, Ser: 0.54 mg/dL (ref 0.44–1.00)
GFR calc Af Amer: >60 mL/min (ref >60)
GFR calc non Af Amer: >60 mL/min (ref >60)
Glucose, Bld: 123 mg/dL — ABNORMAL HIGH (ref 70–99)
Potassium: 4.2 mmol/L (ref 3.5–5.1)
Sodium: 144 mmol/L (ref 135–145)

## 2020-07-19 LAB — CBC
HCT: 36.1 % (ref 36.0–46.0)
Hemoglobin: 11.8 g/dL — ABNORMAL LOW (ref 12.0–15.0)
MCH: 33.5 pg (ref 26.0–34.0)
MCHC: 32.7 g/dL (ref 30.0–36.0)
MCV: 102.6 fL — ABNORMAL HIGH (ref 80.0–100.0)
Platelets: 308 10*3/uL (ref 150–400)
RBC: 3.52 MIL/uL — ABNORMAL LOW (ref 3.87–5.11)
RDW: 13.8 % (ref 11.5–15.5)
WBC: 19.5 10*3/uL — ABNORMAL HIGH (ref 4.0–10.5)
nRBC: 0 % (ref 0.0–0.2)

## 2020-07-19 LAB — GLUCOSE, CAPILLARY
Glucose-Capillary: 116 mg/dL — ABNORMAL HIGH (ref 70–99)
Glucose-Capillary: 117 mg/dL — ABNORMAL HIGH (ref 70–99)
Glucose-Capillary: 126 mg/dL — ABNORMAL HIGH (ref 70–99)
Glucose-Capillary: 132 mg/dL — ABNORMAL HIGH (ref 70–99)
Glucose-Capillary: 141 mg/dL — ABNORMAL HIGH (ref 70–99)
Glucose-Capillary: 153 mg/dL — ABNORMAL HIGH (ref 70–99)

## 2020-07-19 LAB — PHOSPHORUS: Phosphorus: 3 mg/dL (ref 2.5–4.6)

## 2020-07-19 LAB — MAGNESIUM: Magnesium: 2.2 mg/dL (ref 1.7–2.4)

## 2020-07-19 MED ORDER — SODIUM CHLORIDE 0.9 % IV SOLN: INTRAVENOUS | Status: DC | PRN

## 2020-07-19 MED ORDER — CLONIDINE HCL 0.1 MG PO TABS
0.1000 mg | ORAL_TABLET | Freq: Three times a day (TID) | ORAL | Status: DC
  Administered 2020-07-19 – 2020-07-21 (×7): 0.1 mg
  Filled 2020-07-19 (×7): qty 1

## 2020-07-19 NOTE — Progress Notes (Signed)
eLink Physician-Brief Progress Note Patient Name: Emily [name] DOB: 17-Feb-1976 MRN: [id number]   Date of Service  07/19/2020  HPI/Events of Note  Urinary retention with > 300 ml residual urine in the bladder.  eICU Interventions  Foley catheter ordered.        [name] 07/19/2020, 10:44 PM

## 2020-07-19 NOTE — Progress Notes (Signed)
Patient was transported from 4N25 to 3M14 on vent by April White RT with no problems. Upon arrival to 3M14 patient placed back on 28% ATC.

## 2020-07-19 NOTE — Progress Notes (Signed)
HR sustaining in the 120s despite prn metoprolol administration.  Dr. Merrily Pew notified.
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
## 2020-07-20 LAB — GLUCOSE, CAPILLARY
Glucose-Capillary: 121 mg/dL — ABNORMAL HIGH (ref 70–99)
Glucose-Capillary: 121 mg/dL — ABNORMAL HIGH (ref 70–99)
Glucose-Capillary: 128 mg/dL — ABNORMAL HIGH (ref 70–99)
Glucose-Capillary: 132 mg/dL — ABNORMAL HIGH (ref 70–99)
Glucose-Capillary: 133 mg/dL — ABNORMAL HIGH (ref 70–99)
Glucose-Capillary: 135 mg/dL — ABNORMAL HIGH (ref 70–99)

## 2020-07-20 MED ORDER — WHITE PETROLATUM EX OINT
TOPICAL_OINTMENT | CUTANEOUS | Status: AC
  Administered 2020-07-20: 0.2
  Filled 2020-07-20: qty 28.35

## 2020-07-20 MED ORDER — CHLORHEXIDINE GLUCONATE CLOTH 2 % EX PADS
6.0000 | MEDICATED_PAD | Freq: Every day | CUTANEOUS | Status: DC
  Administered 2020-07-21 – 2020-07-25 (×4): 6 via TOPICAL

## 2020-07-20 MED ORDER — SODIUM CHLORIDE 0.9 % IV SOLN
1.0000 g | Freq: Three times a day (TID) | INTRAVENOUS | Status: DC
  Administered 2020-07-20 – 2020-07-21 (×3): 1 g via INTRAVENOUS
  Filled 2020-07-20 (×6): qty 1
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
## 2020-07-21 DIAGNOSIS — S2249XA Multiple fractures of ribs, unspecified side, initial encounter for closed fracture: Secondary | ICD-10-CM

## 2020-07-21 DIAGNOSIS — J9602 Acute respiratory failure with hypercapnia: Secondary | ICD-10-CM

## 2020-07-21 DIAGNOSIS — S062XAA Diffuse traumatic brain injury with loss of consciousness status unknown, initial encounter: Secondary | ICD-10-CM

## 2020-07-21 DIAGNOSIS — Z515 Encounter for palliative care: Secondary | ICD-10-CM

## 2020-07-21 DIAGNOSIS — Z66 Do not resuscitate: Secondary | ICD-10-CM

## 2020-07-21 LAB — GLUCOSE, CAPILLARY
Glucose-Capillary: 127 mg/dL — ABNORMAL HIGH (ref 70–99)
Glucose-Capillary: 117 mg/dL — ABNORMAL HIGH (ref 70–99)
Glucose-Capillary: 119 mg/dL — ABNORMAL HIGH (ref 70–99)
Glucose-Capillary: 95 mg/dL (ref 70–99)

## 2020-07-21 LAB — CULTURE, RESPIRATORY W GRAM STAIN: Gram Stain: NONE SEEN

## 2020-07-21 MED ORDER — IPRATROPIUM-ALBUTEROL 20-100 MCG/ACT IN AERS
1.0000 | INHALATION_SPRAY | Freq: Four times a day (QID) | RESPIRATORY_TRACT | Status: DC
  Filled 2020-07-21: qty 4

## 2020-07-21 MED ORDER — DOCUSATE SODIUM 50 MG/5ML PO LIQD: 100.0000 mg | Freq: Two times a day (BID) | ORAL | Status: DC | PRN

## 2020-07-21 MED ORDER — SENNOSIDES-DOCUSATE SODIUM 8.6-50 MG PO TABS: 2.0000 | ORAL_TABLET | Freq: Every evening | ORAL | Status: DC | PRN

## 2020-07-21 MED ORDER — GLYCOPYRROLATE 0.2 MG/ML IJ SOLN
0.2000 mg | Freq: Four times a day (QID) | INTRAMUSCULAR | Status: DC
  Administered 2020-07-21 – 2020-07-25 (×17): 0.2 mg via INTRAVENOUS
  Filled 2020-07-21 (×17): qty 1

## 2020-07-21 MED ORDER — MORPHINE SULFATE (PF) 2 MG/ML IV SOLN
2.0000 mg | INTRAVENOUS | Status: DC
  Administered 2020-07-21 – 2020-07-25 (×24): 2 mg via INTRAVENOUS
  Filled 2020-07-21 (×25): qty 1

## 2020-07-21 MED ORDER — GUAIFENESIN 100 MG/5ML PO SOLN: 5.0000 mL | ORAL | Status: DC | PRN

## 2020-07-21 MED ORDER — MORPHINE SULFATE (PF) 2 MG/ML IV SOLN: 1.0000 mg | INTRAVENOUS | Status: DC | PRN

## 2020-07-21 MED ORDER — LORAZEPAM 2 MG/ML IJ SOLN: 2.0000 mg | Freq: Four times a day (QID) | INTRAMUSCULAR | Status: DC | PRN

## 2020-07-21 MED ORDER — IPRATROPIUM-ALBUTEROL 20-100 MCG/ACT IN AERS
1.0000 | INHALATION_SPRAY | Freq: Four times a day (QID) | RESPIRATORY_TRACT | Status: DC | PRN
  Filled 2020-07-21: qty 4

## 2020-07-21 NOTE — Consult Note (Signed)
Consultation Note Date: 07/21/2020   Patient Name: Emily [name]  DOB: [birth year]  MRN: [id number]  Age / Sex: 44 y.o., female  PCP: Patient, No Pcp Per Referring Physician: Damita Lack, MD  Reason for Consultation: Establishing goals of care and Psychosocial/spiritual support  HPI/Patient Profile:   Admitted on 07/02/2020 44 year old with history of MS, schizoaffective disorder, depression brought to the hospital after being found unresponsive after a fall at home 2 days prior to admission.    On exam in ED CT showed acute scattered subarachnoid hemorrhage with subdural hygromas and 2 mm left-to-right midline shift.  Neurosurgery was consulted but did not recommend any surgical intervention at that time.  MRI was obtained on 07/14/2020 which showed extensive restrictive diffusion involving the parasagittal left greater than right frontal lobes, high left parietal lobe, anterior medial left temporal lobe/hippocampus possibly the right brain.. These findings are consistent with traumatic cortical contusion/infarcts.  CT spine-negative for acute fractures.  CT chest abdomen pelvis-hazy opacity with displaced rib fractures.   Tested positive for COVID-19 on 8/28, respiratory cultures-positive for H. influenzae.    Long-term prognosis is poor.     Patient currently unable to follow commands, on trach collar, with core track for nutritional support.  EEG negative for seizure activity.    Family face treatment option decisions, advanced directive decisions and anticipatory care needs.    Clinical Assessment and Goals of Care:  This NP Wadie Lessen reviewed medical records, received report from team, then spoke to the patient's sister  to discuss diagnosis, prognosis, GOC, EOL wishes dispos and options.    Communication has been difficult secondary to poor reception where the father lives in difficulty  connecting by telephone.   Concept of Palliative Care was introduced as specialized medical care for people and their families living with serious illness.  If focuses on providing relief from the symptoms and stress of a serious illness.  The goal is to improve quality of life for both the patient and the family.   A  discussion was had today regarding advanced directives.  The difference between a aggressive medical intervention path  and a palliative comfort care path for this patient at this time was had.    Values and goals of care important to patient and family were attempted to be elicited.    Questions and concerns addressed.  Offered virtual visit  Sister verbalizes that she and her family understand the seriousness of the current medical situation and the difficult decisions they are facing however at this time absolutely no decisions will be made until the patient's father can actually see his daughter in person.  Discussed with Dr. Reesa Chew and bedside nurse in attempt to secure permission for patient's father to visit at bedside for a brief amount of time, in order to help him with the difficult decisions that he is facing.  PMT will continue to support holistically.      Late entry: 1500  Patient's father was able to have a brief in person visit  with his daughter.  He videoed patient for other family members.. I then met with Mr. Kinnie Scales, the patient's mother Letitia Libra and the patient's Sister [name].   Continued conversation regarding current medical situation; diagnosis, prognosis, goals of care, end-of-life wishes, disposition and options.  Created space and opportunity for family to explore the thoughts and feelings regarding patient's current medical situation. All family members are in agreement that the patient would "never want any of this".  Her mother tells me that in the past they had conversations and Ahley had verbalized to her that she wanted "to be a DNR  and to be cremated ".  She was a free spirit who liked to walk outside.  According to her family Jessee lived her life her way, leading to her own drum.  The difference between a continued aggressive medical intervention path and a palliative comfort path was again reviewed in detail.  Family verbalized an understanding of the long term poor prognosis and the likelihood that she would not return to baseline.  Understanding that goals of care/plan of care are listed below.  Comfort, and dignity are the focus of care allowing for a natural death.  Plan of Care:  -DNR/DNI -no artificial feeding now or in the future, dc Cor-trac -No further diagnostics, lab draws, xrays -No further IV antibiotic use -Symptom management specific to pain and dyspnea    -Morphine IV 2 mg scheduled every 4 hours for underlying             discomfort    -Morphine IV 2 mg every 2 hours as needed for breakthrough pain/              dyspnea    - Ativan IV 2 mg every 6 hours prn  for agitation   -Robinul 0.2  mg  IV QID- terminal secretions   -Continue Keppra for seizure prevention  -Patient's mother and father wish to have their 15-minute comfort visit tomorrow at 11:00 am    No documented healthcare power of attorney or advanced directive.  The patient's father Sylvester Harder is the main decision maker for this patient.   Daughter and  the patient's mother/Laurie both support decisions made by Sylvester Harder   SUMMARY OF RECOMMENDATIONS    Code Status/Advance Care Planning:  DNR/DNI  comfort, quality and dignity or focus of care moving forward   Additional Recommendations (Limitations, Scope, Preferences):  Full comfort  Psycho-social/Spiritual:   Additional Recommendations: Created space and opportunity for family to explore thoughts and feelings regarding current medical situation.  Prognosis:  - days to week   Discharge Planning:    -  Expect hospital death     Primary  Diagnoses: Present on Admission:  COVID-19  Schizoaffective disorder ([place])   I have reviewed the medical record, interviewed the patient and family, and examined the patient. The following aspects are pertinent.  Past Medical History:  Diagnosis Date   Anxiety    MDD (major depressive disorder)    MS (multiple sclerosis) ([place])    Social History   Socioeconomic History   Marital status: Legally Separated    Spouse name: Not on file   Number of children: Not on file   Years of education: Not on file   Highest education level: Not on file  Occupational History   Not on file  Tobacco Use   Smoking status: Former Smoker   Smokeless tobacco: Never Used  Substance and Sexual Activity   Alcohol use: Yes  Drug use: Yes    Types: Marijuana   Sexual activity: Yes    Birth control/protection: None  Other Topics Concern   Not on file  Social History Narrative   Not on file   Social Determinants of Health   Financial Resource Strain:    Difficulty of Paying Living Expenses: Not on file  Food Insecurity:    Worried About Charity fundraiser in the Last Year: Not on file   [employer] of Food in the Last Year: Not on file  Transportation Needs:    Lack of Transportation (Medical): Not on file   Lack of Transportation (Non-Medical): Not on file  Physical Activity:    Days of Exercise per Week: Not on file   Minutes of Exercise per Session: Not on file  Stress:    Feeling of Stress : Not on file  Social Connections:    Frequency of Communication with Friends and Family: Not on file   Frequency of Social Gatherings with Friends and Family: Not on file   Attends Religious Services: Not on file   Active Member of Clubs or Organizations: Not on file   Attends Archivist Meetings: Not on file   Marital Status: Not on file   Family History  Family history unknown: Yes   Scheduled Meds:  chlorhexidine gluconate (MEDLINE KIT)  15 mL Mouth  Rinse BID   Chlorhexidine Gluconate Cloth  6 each Topical Daily   cloNIDine  0.1 mg Per Tube TID   docusate  100 mg Per Tube BID   enoxaparin (LOVENOX) injection  40 mg Subcutaneous Q24H   feeding supplement (PROSource TF)  45 mL Per Tube Daily   insulin aspart  0-15 Units Subcutaneous Q4H   mouth rinse  15 mL Mouth Rinse 10 times per day   multivitamin with minerals  1 tablet Per Tube Daily   pantoprazole sodium  40 mg Per Tube Daily   polyethylene glycol  17 g Per Tube BID   propranolol  20 mg Per Tube TID   sennosides  5 mL Per Tube BID   Continuous Infusions:  sodium chloride Stopped (07/21/20 0830)   feeding supplement (JEVITY 1.2 CAL) 1,000 mL (07/21/20 0600)   levETIRAcetam Stopped (07/21/20 0216)   meropenem (MERREM) IV 220 mL/hr at 07/21/20 0900   niCARDipine 10 mg/hr (07/18/20 0952)   PRN Meds:.sodium chloride, acetaminophen, acetaminophen, docusate, guaiFENesin, hydrALAZINE, Ipratropium-Albuterol, metoprolol tartrate, polyethylene glycol Medications Prior to Admission:  Prior to Admission medications   Medication Sig Start Date End Date Taking? Authorizing Provider  carbamazepine (TEGRETOL) 100 MG chewable tablet 1 in am 2 at hs Patient not taking: Reported on 04/15/2020 10/30/19   Johnn Hai, MD  ibuprofen (ADVIL) 200 MG tablet Take 200 mg by mouth every 6 (six) hours as needed for moderate pain.    [provider]  lithium carbonate (ESKALITH) 450 MG CR tablet Take 450 mg by mouth 2 (two) times daily. 06/11/20   [provider]  OLANZapine (ZYPREXA) 10 MG tablet Take 10 mg by mouth at bedtime. 06/11/20   [provider]  OLANZapine (ZYPREXA) 20 MG tablet Take 1 tablet (20 mg total) by mouth at bedtime. Patient not taking: Reported on 07/13/2020 10/30/19   Johnn Hai, MD   Allergies  Allergen Reactions   Latex Itching    Latex condoms    Penicillins Anaphylaxis    Has patient had a PCN reaction causing immediate rash,  facial/tongue/throat swelling, SOB or lightheadedness with hypotension:  No Has patient had a PCN reaction causing severe rash involving mucus membranes or skin necrosis: No Has patient had a PCN reaction that required hospitalization: No Has patient had a PCN reaction occurring within the last 10 years: No If all of the above answers are "NO", then may proceed with Cephalosporin use.   Review of Systems  Unable to perform ROS: Acuity of condition    Physical Exam   - no physical exam was performed, secondary to limiting exposure secondary to COVID-19 -Discussed in detail with attending and bedside RN    Vital Signs: BP (!) 124/58    Pulse (!) 103    Temp (!) 97.1 F (36.2 C) (Axillary)    Resp (!) 22    Ht '5\' 8"'  (1.727 m)    Wt 58.1 kg    SpO2 100%    BMI 19.48 kg/m  Pain Scale: CPOT       SpO2: SpO2: 100 % O2 Device:SpO2: 100 % O2 Flow Rate: .O2 Flow Rate (L/min): 2 L/min  IO: Intake/output summary:   Intake/Output Summary (Last 24 hours) at 07/21/2020 1039 Last data filed at 07/21/2020 0954 Gross per 24 hour  Intake 2439.21 ml  Output 1500 ml  Net 939.21 ml    LBM: Last BM Date: 07/19/20 Baseline Weight: Weight: 66.5 kg Most recent weight: Weight: 58.1 kg     Palliative Assessment/Data: 20 %    Discussed with Dr Reesa Chew and bedside RN/Morgan  Time In: 1100 Time Out: 1215 Time Total: 75 minutes Greater than 50%  of this time was spent counseling and coordinating care related to the above assessment and plan.  Late afternoon- meet with family for 30 minutes, see above note  Signed by: Wadie Lessen, NP   Please contact Palliative Medicine Team phone at [phone] for questions and concerns.  For individual provider: See Shea Evans

## 2020-07-21 NOTE — Progress Notes (Signed)
PROGRESS NOTE    ALIZON SCHMELING  ZOX:[id number] DOB: May 19, 1976 DOA: 07/09/2020 PCP: Patient, No Pcp Per   Brief Narrative:  44 year old with history of MS, schizoaffective disorder, depression brought to the hospital after being found unresponsive after a fall at home 2 days prior to admission.  Found to have scattered subarachnoid hemorrhage.  Patient was intubated for airway protection and taken to the ICU.  CT of the head showed acute scattered SAH, subdural hygroma.  CT spine-negative for acute fractures.  CT chest abdomen pelvis-hazy opacity with displaced rib fractures.  MRI brain showed extensive traumatic cortical contusions/infarct.  EEG was slightly abnormal showing sharp waves but no seizure activity.  Tested positive for COVID-19 on 8/28, respiratory cultures-positive for H. influenzae.  Completed course of cefepime which was transitioned to Rocephin.   Assessment & Plan:   Principal Problem:   Acute respiratory failure with hypercapnia (HCC) Active Problems:   Schizoaffective disorder (HCC)   SAH (subarachnoid hemorrhage) (HCC)   COVID-19   Diffuse axonal injury (HCC)   Rib fractures  Diffuse traumatic subarachnoid/subdural hemorrhage, scattered Diffuse axonal injury with encephalopathy -No intervention indicated by neurosurgery. -On prophylaxis Keppra.  EEG negative for active seizure activities. -Patient does not appear to have any higher cortical level of functioning at this time  Acute hypoxic respiratory failure, H. 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  On trach collar -Respiratory cultures positive for H influenza-treated with 7 days of Rocephin -Respiratory cultures positive for Citrobacter-on meropenem -On trach collar.  Supportive care, supplemental oxygen.  Routine trach care.  Asymptomatic COVID-19 infection -Supportive care, as needed bronchodilators  Schizoaffective disorder and depression -Currently home meds are on  hold.  Dysphagia -Currently has core track in place.  Eventual plans for PEG tube placement but prior to this I will consult palliative care services.  Sympathetic storm? -Started on propranolol 20 mg 3 times daily -Clonidine 0.1 mg 3 times daily  Goals of care discussion -We will consult palliative care service.  Patient overall has poor quality of life.  DVT prophylaxis: Lovenox Code Status: Full code Family Communication:  Dorene Sorrow - he is updated.   Status is: Inpatient  Remains inpatient appropriate because:Inpatient level of care appropriate due to severity of illness   Dispo: The patient is from: Home              Anticipated d/c is to: LTAC              Anticipated d/c date is: 3 days              Patient currently is not medically stable to d/c.  Ongoing discussions for long-term goals of care.  None stable for discharge.  Body mass index is 19.48 kg/m.  Subjective: Patient is laying unresponsive. No meaningful interaction.   Spoke with the patient's father Mliss Fritz over the phone. He is reasonably not able to come to a decision without seeing his daughter and requesting to see her before he makes his decision.  I have explained to him that I will try and notify the charge nurse make the arrangements.   Examination:  Constitutional: Not in acute distress, pupil is fixed and dilated bilaterally.  Not reactive to light.  Core track in place Respiratory: Bilateral rhonchi Cardiovascular: Normal sinus rhythm, no rubs Abdomen: Nontender nondistended good bowel sounds Musculoskeletal: No edema noted Skin: No rashes seen Neurologic: Grimaces with physical stimuli but no other interaction Psychiatric: Unable to assess Objective: Vitals:   07/21/20 0400  07/21/20 0500 07/21/20 0600 07/21/20 0700  BP: 105/80 116/78 110/85 113/81  Pulse: 97 95 98 97  Resp: (!) 24 (!) 23 (!) 23 (!) 23  Temp:      TempSrc:      SpO2: 97% 99% 100% 100%  Weight:      Height:         Intake/Output Summary (Last 24 hours) at 07/21/2020 0800 Last data filed at 07/21/2020 0727 Gross per 24 hour  Intake 2174.51 ml  Output 1350 ml  Net 824.51 ml   Filed Weights   07/19/20 0400 07/20/20 0430 07/21/20 0330  Weight: 56.2 kg 58.8 kg 58.1 kg     Data Reviewed:   CBC: Recent Labs  Lab 07/14/20 1033 07/17/20 0829 07/18/20 0829 07/19/20 1812  WBC 26.9* 16.3* 18.8* 19.5*  HGB 12.5 12.7 12.2 11.8*  HCT 37.1 37.5 37.3 36.1  MCV 98.9 98.4 100.5* 102.6*  PLT 294 334 391 433   Basic Metabolic Panel: Recent Labs  Lab 07/14/20 1033 07/14/20 1748 07/15/20 0616 07/15/20 1752 07/17/20 0829 07/18/20 0829 07/19/20 0658  NA 144  --   --   --  142 139 144  K 3.0*  --   --   --  3.3* 3.7 4.2  CL 109  --   --   --  108 106 110  CO2 21*  --   --   --  24 21* 22  GLUCOSE 89  --   --   --  127* 121* 123*  BUN 15  --   --   --  _0 CREATININE 0.70  --   --   --  0.63 0.52 0.54  CALCIUM 8.5*  --   --   --  9.1 8.9 9.6  MG 2.1 2.1 2.0 2.1  --   --  2.2  PHOS 3.2 2.7 2.5 3.0  --   --  3.0   GFR: Estimated Creatinine Clearance: 83.2 mL/min (by C-G formula based on SCr of 0.54 mg/dL). Liver Function Tests: Recent Labs  Lab 07/17/20 0829  AST 38  ALT 46*  ALKPHOS 124  BILITOT 0.9  PROT 6.9  ALBUMIN 3.1*   No results for input(s): LIPASE, AMYLASE in the last 168 hours. No results for input(s): AMMONIA in the last 168 hours. Coagulation Profile: Recent Labs  Lab 07/15/20 0616  INR 1.2   Cardiac Enzymes: No results for input(s): CKTOTAL, CKMB, CKMBINDEX, TROPONINI in the last 168 hours. BNP (last 3 results) No results for input(s): PROBNP in the last 8760 hours. HbA1C: No results for input(s): HGBA1C in the last 72 hours. CBG: Recent Labs  Lab 07/20/20 1640 07/20/20 1924 07/20/20 2353 07/21/20 0305 07/21/20 0749  GLUCAP 121* 135* 132* 127* 119*   Lipid Profile: No results for input(s): CHOL, HDL, LDLCALC, TRIG, CHOLHDL, LDLDIRECT in the last 72  hours. Thyroid Function Tests: No results for input(s): TSH, T4TOTAL, FREET4, T3FREE, THYROIDAB in the last 72 hours. Anemia Panel: No results for input(s): VITAMINB12, FOLATE, FERRITIN, TIBC, IRON, RETICCTPCT in the last 72 hours. Sepsis Labs: No results for input(s): PROCALCITON, LATICACIDVEN in the last 168 hours.  Recent Results (from the past 240 hour(s))  SARS Coronavirus 2 by RT PCR (hospital order, performed in [facility] hospital lab) Nasopharyngeal Nasopharyngeal Swab     Status: Abnormal   Collection Time: 07/14/2020  7:43 PM   Specimen: Nasopharyngeal Swab  Result Value Ref Range Status   SARS Coronavirus 2 POSITIVE (A) NEGATIVE  Final    Comment: RESULT CALLED TO, READ BACK BY AND VERIFIED WITH: T. PHILLIPS,RN 2119 07/11/2020 T. TYSOR (NOTE) SARS-CoV-2 target nucleic acids are DETECTED  SARS-CoV-2 RNA is generally detectable in upper respiratory specimens  during the acute phase of infection.  Positive results are indicative  of the presence of the identified virus, but do not rule out bacterial infection or co-infection with other pathogens not detected by the test.  Clinical correlation with patient history and  other diagnostic information is necessary to determine patient infection status.  The expected result is negative.  Fact Sheet for Patients:   StrictlyIdeas.no   Fact Sheet for Healthcare Providers:   BankingDealers.co.za    This test is not yet approved or cleared by the [place] FDA and  has been authorized for detection and/or diagnosis of SARS-CoV-2 by FDA under an Emergency Use Authorization (EUA).  This EUA will remain in effect (meaning th is test can be used) for the duration of  the COVID-19 declaration under Section 564(b)(1) of the Act, 21 U.S.C. section 360-bbb-3(b)(1), unless the authorization is terminated or revoked sooner.  Performed at [facility], [place] [address], [place],  [place] [address]   Culture, respiratory (non-expectorated)     Status: None   Collection Time: 07/13/20 10:08 PM   Specimen: Tracheal Aspirate; Respiratory  Result Value Ref Range Status   Specimen Description TRACHEAL ASPIRATE  Final   Special Requests NONE  Final   Gram Stain   Final    MODERATE WBC PRESENT, PREDOMINANTLY PMN FEW GRAM POSITIVE RODS RARE GRAM POSITIVE COCCI    Culture   Final    FEW HAEMOPHILUS INFLUENZAE BETA LACTAMASE NEGATIVE Performed at [facility], [address] [place], [place] [address]    Report Status 07/15/2020 FINAL  Final  MRSA PCR Screening     Status: None   Collection Time: 07/13/20 10:08 PM   Specimen: Nasal Mucosa; Nasopharyngeal  Result Value Ref Range Status   MRSA by PCR NEGATIVE NEGATIVE Final    Comment:        The GeneXpert MRSA Assay (FDA approved for NASAL specimens only), is one component of a comprehensive MRSA colonization surveillance program. It is not intended to diagnose MRSA infection nor to guide or monitor treatment for MRSA infections. Performed at [facility], [place] [address] [place], [place] [address]   Culture, respiratory     Status: None   Collection Time: 07/15/20  2:38 PM   Specimen: Bronchoalveolar Lavage; Respiratory  Result Value Ref Range Status   Specimen Description BRONCHIAL ALVEOLAR LAVAGE  Final   Special Requests NONE  Final   Gram Stain   Final    MODERATE WBC PRESENT,BOTH PMN AND MONONUCLEAR NO ORGANISMS SEEN    Culture   Final    NO GROWTH 2 DAYS Performed at [facility], [address] [place], [place] [address]    Report Status 07/17/2020 FINAL  Final  Culture, respiratory (non-expectorated)     Status: None (Preliminary result)   Collection Time: 07/19/20 10:06 AM   Specimen: Tracheal Aspirate; Respiratory  Result Value Ref Range Status   Specimen Description TRACHEAL ASPIRATE  Final   Special Requests NONE  Final   Gram Stain   Final    NO WBC SEEN NO ORGANISMS  SEEN Performed at [facility], [address], [place], [place] [address]    Culture FEW ACINETOBACTER BAUMANNII  Final   Report Status  PENDING  Incomplete  Culture, blood (routine x 2)     Status: None (Preliminary result)   Collection Time: 07/19/20  6:30 PM   Specimen: BLOOD LEFT ARM  Result Value Ref Range Status   Specimen Description BLOOD LEFT ARM  Final   Special Requests   Final    AEROBIC BOTTLE ONLY Blood Culture results may not be optimal due to an inadequate volume of blood received in culture bottles   Culture   Final    NO GROWTH < 12 HOURS Performed at [facility], [address] [place], [place] [address]    Report Status PENDING  Incomplete  Culture, blood (routine x 2)     Status: None (Preliminary result)   Collection Time: 07/19/20  6:35 PM   Specimen: BLOOD LEFT HAND  Result Value Ref Range Status   Specimen Description BLOOD LEFT HAND  Final   Special Requests   Final    BOTTLES DRAWN AEROBIC AND ANAEROBIC Blood Culture adequate volume   Culture   Final    NO GROWTH < 12 HOURS Performed at [facility], [place] [address], [place],  [address]    Report Status PENDING  Incomplete         Radiology Studies: No results found.      Scheduled Meds: . chlorhexidine gluconate (MEDLINE KIT)  15 mL Mouth Rinse BID  . Chlorhexidine Gluconate Cloth  6 each Topical Daily  . cloNIDine  0.1 mg Per Tube TID  . docusate  100 mg Per Tube BID  . enoxaparin (LOVENOX) injection  40 mg Subcutaneous Q24H  . feeding supplement (PROSource TF)  45 mL Per Tube Daily  . insulin aspart  0-15 Units Subcutaneous Q4H  . Ipratropium-Albuterol  1 puff Inhalation Q6H  . mouth rinse  15 mL Mouth Rinse 10 times per day  . multivitamin with minerals  1 tablet Per Tube Daily  . pantoprazole sodium  40 mg Per Tube Daily  . polyethylene glycol  17 g Per Tube BID  . propranolol  20 mg Per Tube TID  . sennosides  5 mL Per Tube BID   Continuous  Infusions: . sodium chloride 10 mL/hr at 07/21/20 0700  . feeding supplement (JEVITY 1.2 CAL) 1,000 mL (07/21/20 0600)  . levETIRAcetam Stopped (07/21/20 0216)  . meropenem (MERREM) IV Stopped (07/21/20 0027)  . niCARDipine 10 mg/hr (07/18/20 0952)     LOS: 9 days   Time spent= 35 mins    Onell Mcmath Arsenio Loader, MD [facility]  If 7PM-7AM, please contact night-coverage  07/21/2020, 8:00 AM

## 2020-07-21 NOTE — Progress Notes (Signed)
Spoke with [facility] from Palliative, she met with the family. Patient is now comfort care.   Appreciate her assistance.  Transfer patient to med surg.   Gerlean Ren MD [facility]

## 2020-07-21 NOTE — Progress Notes (Signed)
Physical Therapy Treatment Patient Details Name: Emily [name] MRN: [id number] DOB: 1976/11/01 Today's Date: 07/21/2020    History of Present Illness 44 y.o. F with  MS, schizoaffective disorder, depression who was brought in by EMS from home unresponsive after a "fall" two days ago.   Per EMS report, pt's boyfriend said patient fell down the stairs two days ago and has been laying in bed unresponsive ever since.  She was responsive only to pain and was intubated in the ED. CT head with multiple, acute scattered subarachnoid hemorrhages with subdural hygromas and 68mm L to R midline shift.  Per Neurosurgery, no indication for surgical intervention and recommended supportive care.  Further work-up revealed multiple displaced R rib fractures, L4 transverse process fracture and GGO's in bilateral lower lobes.  Pt was also found to be Covid-19+. Pt underwent tracheostomy on 07/15/2020.    PT Comments    Noted no response to noxious stimuli or mobility to EOB and assisted upright sitting posture.    Follow Up Recommendations  SNF;LTACH     Equipment Recommendations  Wheelchair (measurements PT);Wheelchair cushion (measurements PT);Hospital bed    Recommendations for Other Services       Precautions / Restrictions Precautions Precautions: Fall    Mobility  Bed Mobility Overal bed mobility: Needs Assistance Bed Mobility: Supine to Sit;Sit to Supine     Supine to sit: Total assist;+2 for physical assistance Sit to supine: Total assist;+2 for physical assistance   General bed mobility comments: no purposeful responses or reactions to transition and sitting EOB.  Transfers                 General transfer comment: NT  Ambulation/Gait             General Gait Details: not able   Stairs             Wheelchair Mobility    Modified Rankin (Stroke Patients Only) Modified Rankin (Stroke Patients Only) Pre-Morbid Rankin Score: No symptoms Modified Rankin:  Severe disability     Balance Overall balance assessment: Needs assistance Sitting-balance support: No upper extremity supported;Feet supported Sitting balance-Leahy Scale: Zero Sitting balance - Comments: no attempt to right herself in any position                                    Cognition Arousal/Alertness: Lethargic Behavior During Therapy: Flat affect Overall Cognitive Status: Difficult to assess                                        Exercises Other Exercises Other Exercises: PROM to UE's and LE's.  All ranges still WNL    General Comments General comments (skin integrity, edema, etc.): VSS in all positions.      Pertinent Vitals/Pain Pain Assessment: Faces Faces Pain Scale: No hurt Pain Location: no grimace or purposeful response to noxious stimuli today Pain Intervention(s): Monitored during session    Home Living                      Prior Function            PT Goals (current goals can now be found in the care plan section) Acute Rehab PT Goals Patient Stated Goal: pt unable to state, PT goal to improve arousal and strength PT  Goal Formulation: Patient unable to participate in goal setting Time For Goal Achievement: 07/30/20 Potential to Achieve Goals: Poor Progress towards PT goals: Not progressing toward goals - comment (no change yet)    Frequency    Min 2X/week      PT Plan Current plan remains appropriate    Co-evaluation              AM-PAC PT "6 Clicks" Mobility   Outcome Measure  Help needed turning from your back to your side while in a flat bed without using bedrails?: Total Help needed moving from lying on your back to sitting on the side of a flat bed without using bedrails?: Total Help needed moving to and from a bed to a chair (including a wheelchair)?: Total Help needed standing up from a chair using your arms (e.g., wheelchair or bedside chair)?: Total Help needed to walk in  hospital room?: Total Help needed climbing 3-5 steps with a railing? : Total 6 Click Score: 6    End of Session Equipment Utilized During Treatment: Oxygen Activity Tolerance: Patient limited by lethargy (no response to input yet) Patient left: in bed;with call bell/phone within reach;with bed alarm set Nurse Communication: Mobility status;Need for lift equipment PT Visit Diagnosis: Other abnormalities of gait and mobility (R26.89);Other symptoms and signs involving the nervous system (R29.898);History of falling (Z91.81)     Time: 1937-9024 PT Time Calculation (min) (ACUTE ONLY): 22 min  Charges:  $Therapeutic Activity: 8-22 mins                     07/21/2020  Jacinto Halim., PT Acute Rehabilitation Services [phone]  (pager) [phone]  (office)   Emily [name] 07/21/2020, 3:18 PM

## 2020-07-21 NOTE — Progress Notes (Signed)
SLP Cancellation Note  Patient Details Name: Emily [name] MRN: [id number] DOB: 1976/04/08   Cancelled treatment:  Per RN, pt is transitioning to comfort care.  Our service will respectfully sign off.  Ellyana Crigler L. Samson Frederic, MA CCC/SLP Acute Rehabilitation Services Office number [phone] Pager [phone]          Blenda Mounts Laurice 07/21/2020, 3:54 PM

## 2020-07-22 DIAGNOSIS — R52 Pain, unspecified: Secondary | ICD-10-CM

## 2020-07-22 LAB — GLUCOSE, CAPILLARY: Glucose-Capillary: 120 mg/dL — ABNORMAL HIGH (ref 70–99)

## 2020-07-22 MED ORDER — DIPHENHYDRAMINE HCL 50 MG/ML IJ SOLN: 12.5000 mg | Freq: Four times a day (QID) | INTRAMUSCULAR | Status: DC | PRN

## 2020-07-22 NOTE — Progress Notes (Signed)
PROGRESS NOTE    Emily [name]  EPP:[id number] DOB: January 11, 1976 DOA: 06/15/2020 PCP: Patient, No Pcp Per   Brief Narrative:  44 year old with history of MS, schizoaffective disorder, depression brought to the hospital after being found unresponsive after a fall at home 2 days prior to admission.  Found to have scattered subarachnoid hemorrhage.  Patient was intubated for airway protection and taken to the ICU.  CT of the head showed acute scattered SAH, subdural hygroma.  CT spine-negative for acute fractures.  CT chest abdomen pelvis-hazy opacity with displaced rib fractures.  MRI brain showed extensive traumatic cortical contusions/infarct.  EEG was slightly abnormal showing sharp waves but no seizure activity.  Tested positive for COVID-19 on 8/28, respiratory cultures-positive for H. influenzae.  Completed course of cefepime which was transitioned to Rocephin.  After discussion with palliative care team, patient was transitioned to comfort care.   Assessment & Plan:   Principal Problem:   Acute respiratory failure with hypercapnia (HCC) Active Problems:   Schizoaffective disorder (HCC)   SAH (subarachnoid hemorrhage) (HCC)   COVID-19   Diffuse axonal injury ([place])   Rib fractures   Palliative care by specialist   DNR (do not resuscitate)  Diffuse traumatic subarachnoid/subdural hemorrhage, scattered Diffuse axonal injury with encephalopathy -No intervention indicated by neurosurgery. -Continue prophylaxis Keppra but patient has been transitioned to comfort care now.  Acute hypoxic respiratory failure, H. 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  On trach collar -Routine trach care.  Discontinue antibiotics.  Patient is comfort care.  Asymptomatic COVID-19 infection -Supportive care, as needed bronchodilators  Schizoaffective disorder and depression -Currently home meds are on hold.  Dysphagia -Core track removed.  Patient is comfort care.  Sympathetic  storm? -Discontinue home routine medications  Goals of care discussion -Seen by palliative care service. DVT prophylaxis: Lovenox Code Status: Full code Family Communication: Called her father vaginal, no answer.  No option to leave a voicemail.  Status is: Inpatient  Remains inpatient appropriate because:Inpatient level of care appropriate due to severity of illness   Dispo: The patient is from: Home              Anticipated d/c is to: LTAC              Anticipated d/c date is: 3 days              Patient currently is not medically stable to d/c.  Ongoing discussions for long-term goals of care.  None stable for discharge.  Body mass index is 19.48 kg/m.  Subjective: Patient is laying in the bed unresponsive.  No meaningful interaction.  No response to verbal or physical stimuli   Examination:  Constitutional: Not in acute distress, bilateral dilated pupils-and reactive Respiratory: Tracheostomy in place Cardiovascular: Normal sinus rhythm Abdomen: Nontender nondistended good bowel sounds Musculoskeletal: No edema noted Skin: No rashes seen Neurologic: Unable to assess Psychiatric: Unable to assess   Objective: Vitals:   07/21/20 1546 07/21/20 1600 07/21/20 2227 07/22/20 0807  BP:  122/82 131/90   Pulse: (!) 107 (!) 109 (!) 128 (!) 110  Resp: (!) 26 (!) 24 (!) 21 20  Temp:   99.2 F (37.3 C)   TempSrc:   Axillary   SpO2: 98% 95% 98% 97%  Weight:      Height:        Intake/Output Summary (Last 24 hours) at 07/22/2020 1235 Last data filed at 07/22/2020 1046 Gross per 24 hour  Intake 413.7 ml  Output 710 ml  Net -296.3 ml   Filed Weights   07/19/20 0400 07/20/20 0430 07/21/20 0330  Weight: 56.2 kg 58.8 kg 58.1 kg     Data Reviewed:   CBC: Recent Labs  Lab 07/17/20 0829 07/18/20 0829 07/19/20 1812  WBC 16.3* 18.8* 19.5*  HGB 12.7 12.2 11.8*  HCT 37.5 37.3 36.1  MCV 98.4 100.5* 102.6*  PLT 334 391 329   Basic Metabolic Panel: Recent Labs  Lab  07/15/20 1752 07/17/20 0829 07/18/20 0829 07/19/20 0658  NA  --  142 139 144  K  --  3.3* 3.7 4.2  CL  --  108 106 110  CO2  --  24 21* 22  GLUCOSE  --  127* 121* 123*  BUN  --  _0 CREATININE  --  0.63 0.52 0.54  CALCIUM  --  9.1 8.9 9.6  MG 2.1  --   --  2.2  PHOS 3.0  --   --  3.0   GFR: Estimated Creatinine Clearance: 83.2 mL/min (by C-G formula based on SCr of 0.54 mg/dL). Liver Function Tests: Recent Labs  Lab 07/17/20 0829  AST 38  ALT 46*  ALKPHOS 124  BILITOT 0.9  PROT 6.9  ALBUMIN 3.1*   No results for input(s): LIPASE, AMYLASE in the last 168 hours. No results for input(s): AMMONIA in the last 168 hours. Coagulation Profile: No results for input(s): INR, PROTIME in the last 168 hours. Cardiac Enzymes: No results for input(s): CKTOTAL, CKMB, CKMBINDEX, TROPONINI in the last 168 hours. BNP (last 3 results) No results for input(s): PROBNP in the last 8760 hours. HbA1C: No results for input(s): HGBA1C in the last 72 hours. CBG: Recent Labs  Lab 07/20/20 2353 07/21/20 0305 07/21/20 0749 07/21/20 0820 07/21/20 1226  GLUCAP 132* 127* 119* 117* 95   Lipid Profile: No results for input(s): CHOL, HDL, LDLCALC, TRIG, CHOLHDL, LDLDIRECT in the last 72 hours. Thyroid Function Tests: No results for input(s): TSH, T4TOTAL, FREET4, T3FREE, THYROIDAB in the last 72 hours. Anemia Panel: No results for input(s): VITAMINB12, FOLATE, FERRITIN, TIBC, IRON, RETICCTPCT in the last 72 hours. Sepsis Labs: No results for input(s): PROCALCITON, LATICACIDVEN in the last 168 hours.  Recent Results (from the past 240 hour(s))  SARS Coronavirus 2 by RT PCR (hospital order, performed in [facility] hospital lab) Nasopharyngeal Nasopharyngeal Swab     Status: Abnormal   Collection Time: 07/11/2020  7:43 PM   Specimen: Nasopharyngeal Swab  Result Value Ref Range Status   SARS Coronavirus 2 POSITIVE (A) NEGATIVE Final    Comment: RESULT CALLED TO, READ BACK BY AND VERIFIED  WITH: T. PHILLIPS,RN 2119 07/11/2020 T. TYSOR (NOTE) SARS-CoV-2 target nucleic acids are DETECTED  SARS-CoV-2 RNA is generally detectable in upper respiratory specimens  during the acute phase of infection.  Positive results are indicative  of the presence of the identified virus, but do not rule out bacterial infection or co-infection with other pathogens not detected by the test.  Clinical correlation with patient history and  other diagnostic information is necessary to determine patient infection status.  The expected result is negative.  Fact Sheet for Patients:   StrictlyIdeas.no   Fact Sheet for Healthcare Providers:   BankingDealers.co.za    This test is not yet approved or cleared by the [place] FDA and  has been authorized for detection and/or diagnosis of SARS-CoV-2 by FDA under an Emergency Use Authorization (EUA).  This EUA will remain in effect (meaning th is test can  be used) for the duration of  the COVID-19 declaration under Section 564(b)(1) of the Act, 21 U.S.C. section 360-bbb-3(b)(1), unless the authorization is terminated or revoked sooner.  Performed at [facility], [place] [address] [place], [place] [address]   Culture, respiratory (non-expectorated)     Status: None   Collection Time: 07/13/20 10:08 PM   Specimen: Tracheal Aspirate; Respiratory  Result Value Ref Range Status   Specimen Description TRACHEAL ASPIRATE  Final   Special Requests NONE  Final   Gram Stain   Final    MODERATE WBC PRESENT, PREDOMINANTLY PMN FEW GRAM POSITIVE RODS RARE GRAM POSITIVE COCCI    Culture   Final    FEW HAEMOPHILUS INFLUENZAE BETA LACTAMASE NEGATIVE Performed at [facility], [address], [place], [place] [address]    Report Status 07/15/2020 FINAL  Final  MRSA PCR Screening     Status: None   Collection Time: 07/13/20 10:08 PM   Specimen: Nasal Mucosa; Nasopharyngeal  Result Value Ref Range  Status   MRSA by PCR NEGATIVE NEGATIVE Final    Comment:        The GeneXpert MRSA Assay (FDA approved for NASAL specimens only), is one component of a comprehensive MRSA colonization surveillance program. It is not intended to diagnose MRSA infection nor to guide or monitor treatment for MRSA infections. Performed at [facility], [place] [address] [place], [place] [address]   Culture, respiratory     Status: None   Collection Time: 07/15/20  2:38 PM   Specimen: Bronchoalveolar Lavage; Respiratory  Result Value Ref Range Status   Specimen Description BRONCHIAL ALVEOLAR LAVAGE  Final   Special Requests NONE  Final   Gram Stain   Final    MODERATE WBC PRESENT,BOTH PMN AND MONONUCLEAR NO ORGANISMS SEEN    Culture   Final    NO GROWTH 2 DAYS Performed at [facility], [address] [place], [place] [address]    Report Status 07/17/2020 FINAL  Final  Culture, respiratory (non-expectorated)     Status: None   Collection Time: 07/19/20 10:06 AM   Specimen: Tracheal Aspirate; Respiratory  Result Value Ref Range Status   Specimen Description TRACHEAL ASPIRATE  Final   Special Requests NONE  Final   Gram Stain   Final    NO WBC SEEN NO ORGANISMS SEEN Performed at [facility], [address], [place], [place] [address]    Culture FEW ACINETOBACTER CALCOACETICUS/BAUMANNII COMPLEX  Final   Report Status 07/21/2020 FINAL  Final   Organism ID, Bacteria ACINETOBACTER CALCOACETICUS/BAUMANNII COMPLEX  Final      Susceptibility   Acinetobacter calcoaceticus/baumannii complex - MIC*    CEFTAZIDIME 4 SENSITIVE Sensitive     CIPROFLOXACIN <=0.25 SENSITIVE Sensitive     GENTAMICIN <=1 SENSITIVE Sensitive     IMIPENEM <=0.25 SENSITIVE Sensitive     PIP/TAZO <=4 SENSITIVE Sensitive     TRIMETH/SULFA 160 RESISTANT Resistant     AMPICILLIN/SULBACTAM <=2 SENSITIVE Sensitive     * FEW ACINETOBACTER CALCOACETICUS/BAUMANNII COMPLEX  Culture, blood (routine x 2)     Status:  None (Preliminary result)   Collection Time: 07/19/20  6:30 PM   Specimen: BLOOD LEFT ARM  Result Value Ref Range Status   Specimen Description BLOOD LEFT ARM  Final   Special Requests   Final    AEROBIC BOTTLE ONLY Blood Culture results may not be optimal due to an inadequate volume of blood received in culture  bottles   Culture   Final    NO GROWTH 3 DAYS Performed at [facility], [place] [address] [place], [place] [address]    Report Status PENDING  Incomplete  Culture, blood (routine x 2)     Status: None (Preliminary result)   Collection Time: 07/19/20  6:35 PM   Specimen: BLOOD LEFT HAND  Result Value Ref Range Status   Specimen Description BLOOD LEFT HAND  Final   Special Requests   Final    BOTTLES DRAWN AEROBIC AND ANAEROBIC Blood Culture adequate volume   Culture   Final    NO GROWTH 3 DAYS Performed at [facility], [place] [address] [place], [place] [address]    Report Status PENDING  Incomplete         Radiology Studies: No results found.      Scheduled Meds: . chlorhexidine gluconate (MEDLINE KIT)  15 mL Mouth Rinse BID  . Chlorhexidine Gluconate Cloth  6 each Topical Daily  . glycopyrrolate  0.2 mg Intravenous QID  . mouth rinse  15 mL Mouth Rinse 10 times per day  .  morphine injection  2 mg Intravenous Q4H   Continuous Infusions: . sodium chloride Stopped (07/21/20 1616)  . levETIRAcetam 1,000 mg (07/22/20 0251)  . niCARDipine 10 mg/hr (07/18/20 0952)     LOS: 10 days   Time spent= 35 mins    Emily [name] Arsenio Loader, MD [facility]  If 7PM-7AM, please contact night-coverage  07/22/2020, 12:35 PM

## 2020-07-22 NOTE — Progress Notes (Signed)
Nutrition Brief Note  Chart reviewed. Pt full comfort care. Tube feeding and Cortrak NGT discontinued. No further nutrition interventions warranted at this time. Please re-consult as needed.   Roslyn Smiling, MS, RD, LDN RD pager number/after hours weekend pager number on Amion.

## 2020-07-22 NOTE — TOC Transition Note (Signed)
Transition of Care [facility]) - CM/SW Discharge Note   Patient Details  Name: FLANNERY CAVALLERO MRN: [id number] Date of Birth: [birth year]  Transition of Care [facility]) CM/SW Contact:  Lockie Pares, RN Phone Number: 07/22/2020, 9:10 AM   Clinical Narrative:    Comfort measures enacted. Patient is a DNR.  Patient will be a ME case post expiration.     Barriers to Discharge: Unsafe home situation, ED Uninsured needing placement-LOG, ED Uninsured needing medication assistance, ED Uninsured needing PCP establishment   Patient Goals and CMS Choice        Discharge Placement                       Discharge Plan and Services                                     Social Determinants of Health (SDOH) Interventions     Readmission Risk Interventions No flowsheet data found.

## 2020-07-22 NOTE — Progress Notes (Signed)
Pt foley was inserted per MD order for comfort care measures. On-call physician Elizabeth Sauer) contacted due to pt having a latex allergy (condoms specifically listed) and this foley contains natural latex per packaging.  Pt latex allergy says it causes itching. Pt unresponsive and unable to validate itching.  On call was informed that pt also had  vaginal discharge that was present prior to this foley insertion. PRN benadryl ordered until day MD team can assess pt and address issue. Awaiting pharmacy to validate benadryl order. Oncoming RN informed of this issue and benadryl that is to be given to pt.

## 2020-07-22 NOTE — Progress Notes (Signed)
Patient ID: Emily [name], female   DOB: 1976/08/17, 44 y.o.   MRN: [id number]  This NP spoke with treatment team and bedside RN.   Focus of care is full comfort.  According to nursing patient appears comfortable and is unresponsive and unable to follow commands.  Step-mother/Laurie and father/ Westly Pam came to visit under comfort care visitation policy.  They are grateful for this visit.  Family understand the limited prognosis.  They are at peace with their decision for a full comfort path focusing on comfort and dignity.  Education offered on the natural trajectory and expectations and end-of-life.   Prognosis is likely days to a week.  Questions and concerns addressed   Discussed with Dr Nelson Chimes via secure chat  Total time spent on the unit was 25 minutes    PMT will continue to support holistically.  Greater than 50% of the time was spent in counseling and coordination of care  Lorinda Creed NP  Palliative Medicine Team Team Phone # [phone] Pager [phone]

## 2020-07-23 NOTE — Progress Notes (Signed)
Patient ID: AVIANCE COOPERWOOD, female   DOB: [birth year], 44 y.o.   MRN: [id number]  This NP spoke with treatment team and bedside RN.   Focus of care is full comfort.  According to nursing patient appears comfortable and is unresponsive and unable to follow commands.  Spoke with  father/ Westly Pam by telephone to update on his daughter's condition.    He appreciates daily updates  Family understand the limited prognosis.  They are at peace with their decision for a full comfort path focusing on comfort and dignity.  Education offered on the natural trajectory and expectations and end-of-life.   Prognosis is likely days.  Questions and concerns addressed   Discussed with Dr Randol Kern   Total time spent on the unit was 15 minutes    PMT will continue to support holistically.  Greater than 50% of the time was spent in counseling and coordination of care  Lorinda Creed NP  Palliative Medicine Team Team Phone # [phone] Pager [phone]

## 2020-07-23 NOTE — Progress Notes (Signed)
PROGRESS NOTE    Emily [name]  DGU:[id number] DOB: January 03, 1976 DOA: 07/03/2020 PCP: Patient, No Pcp Per   Brief Narrative:  44 year old with history of MS, schizoaffective disorder, depression brought to the hospital after being found unresponsive after a fall at home 2 days prior to admission.  Found to have scattered subarachnoid hemorrhage.  Patient was intubated for airway protection and taken to the ICU.  CT of the head showed acute scattered SAH, subdural hygroma.  CT spine-negative for acute fractures.  CT chest abdomen pelvis-hazy opacity with displaced rib fractures.  MRI brain showed extensive traumatic cortical contusions/infarct.  EEG was slightly abnormal showing sharp waves but no seizure activity.  Tested positive for COVID-19 on 8/28, respiratory cultures-positive for H. influenzae.  Completed course of cefepime which was transitioned to Rocephin.  After discussion with palliative care team, patient was transitioned to comfort care.   Assessment & Plan:   Principal Problem:   Acute respiratory failure with hypercapnia (HCC) Active Problems:   Schizoaffective disorder (HCC)   SAH (subarachnoid hemorrhage) (HCC)   COVID-19   Diffuse axonal injury ([place])   Rib fractures   Palliative care by specialist   DNR (do not resuscitate)   Pain, generalized  Diffuse traumatic subarachnoid/subdural hemorrhage, scattered Diffuse axonal injury with encephalopathy -No intervention indicated by neurosurgery. -Continue prophylaxis Keppra but patient has been transitioned to comfort care now.  Acute hypoxic respiratory failure, H. 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  On trach collar -Routine trach care.  Discontinue antibiotics.  Patient is comfort care.  Asymptomatic COVID-19 infection -Supportive care, as needed bronchodilators  Schizoaffective disorder and depression -Currently home meds are on hold.  Dysphagia -Core track removed.  Patient is comfort  care.  Sympathetic storm? -Discontinue home routine medications  Goals of care discussion -Seen by palliative care service.  Patient has been transitioned to full comfort measure, comfort/palliative measures managed per palliative medicine.   DVT prophylaxis: comfort care Code Status: comfort  Care  Family Communication: Palliative medicine discussing with family.  Status is: Inpatient  Remains inpatient appropriate because:Inpatient level of care appropriate due to severity of illness   Dispo: The patient is from: Home              Anticipated d/c is to: Hospital death              Anticipated d/c date is: 2 days              Patient currently is not medically stable to d/c.  Ongoing discussions for long-term goals of care.  None stable for discharge.  Body mass index is 19.48 kg/m.  Subjective: Patient is laying in the bed unresponsive.  No meaningful interaction.  No response to verbal or physical stimuli   Examination:  Patient is sleeping comfortably, unresponsive, in no apparent distress  Tracheostomy in place  Diminished air entry bilaterally with shallow breathing  Normal sinus rhythm  Unable to assess neurologic or psychiatric status given she is unresponsive     Objective: Vitals:   07/22/20 1441 07/23/20 0036 07/23/20 0905 07/23/20 1422  BP: 122/87 131/79  117/80  Pulse:  (!) 117 96 (!) 118  Resp: (!) 23 20 (!) 26 13  Temp: 99.8 F (37.7 C) 98.6 F (37 C)  99.4 F (37.4 C)  TempSrc: Axillary Axillary  Axillary  SpO2: 99% 98%  97%  Weight:      Height:        Intake/Output Summary (Last 24 hours) at  07/23/2020 1512 Last data filed at 07/23/2020 0905 Gross per 24 hour  Intake 0 ml  Output 1250 ml  Net -1250 ml   Filed Weights   07/19/20 0400 07/20/20 0430 07/21/20 0330  Weight: 56.2 kg 58.8 kg 58.1 kg     Data Reviewed:   CBC: Recent Labs  Lab 07/17/20 0829 07/18/20 0829 07/19/20 1812  WBC 16.3* 18.8* 19.5*  HGB 12.7 12.2 11.8*  HCT  37.5 37.3 36.1  MCV 98.4 100.5* 102.6*  PLT 334 391 778   Basic Metabolic Panel: Recent Labs  Lab 07/17/20 0829 07/18/20 0829 07/19/20 0658  NA 142 139 144  K 3.3* 3.7 4.2  CL 108 106 110  CO2 24 21* 22  GLUCOSE 127* 121* 123*  BUN _0 CREATININE 0.63 0.52 0.54  CALCIUM 9.1 8.9 9.6  MG  --   --  2.2  PHOS  --   --  3.0   GFR: Estimated Creatinine Clearance: 83.2 mL/min (by C-G formula based on SCr of 0.54 mg/dL). Liver Function Tests: Recent Labs  Lab 07/17/20 0829  AST 38  ALT 46*  ALKPHOS 124  BILITOT 0.9  PROT 6.9  ALBUMIN 3.1*   No results for input(s): LIPASE, AMYLASE in the last 168 hours. No results for input(s): AMMONIA in the last 168 hours. Coagulation Profile: No results for input(s): INR, PROTIME in the last 168 hours. Cardiac Enzymes: No results for input(s): CKTOTAL, CKMB, CKMBINDEX, TROPONINI in the last 168 hours. BNP (last 3 results) No results for input(s): PROBNP in the last 8760 hours. HbA1C: No results for input(s): HGBA1C in the last 72 hours. CBG: Recent Labs  Lab 07/20/20 2353 07/21/20 0305 07/21/20 0749 07/21/20 0820 07/21/20 1226  GLUCAP 132* 127* 119* 117* 95   Lipid Profile: No results for input(s): CHOL, HDL, LDLCALC, TRIG, CHOLHDL, LDLDIRECT in the last 72 hours. Thyroid Function Tests: No results for input(s): TSH, T4TOTAL, FREET4, T3FREE, THYROIDAB in the last 72 hours. Anemia Panel: No results for input(s): VITAMINB12, FOLATE, FERRITIN, TIBC, IRON, RETICCTPCT in the last 72 hours. Sepsis Labs: No results for input(s): PROCALCITON, LATICACIDVEN in the last 168 hours.  Recent Results (from the past 240 hour(s))  Culture, respiratory (non-expectorated)     Status: None   Collection Time: 07/13/20 10:08 PM   Specimen: Tracheal Aspirate; Respiratory  Result Value Ref Range Status   Specimen Description TRACHEAL ASPIRATE  Final   Special Requests NONE  Final   Gram Stain   Final    MODERATE WBC PRESENT,  PREDOMINANTLY PMN FEW GRAM POSITIVE RODS RARE GRAM POSITIVE COCCI    Culture   Final    FEW HAEMOPHILUS INFLUENZAE BETA LACTAMASE NEGATIVE Performed at [facility], [address], [place], [place] [address]    Report Status 07/15/2020 FINAL  Final  MRSA PCR Screening     Status: None   Collection Time: 07/13/20 10:08 PM   Specimen: Nasal Mucosa; Nasopharyngeal  Result Value Ref Range Status   MRSA by PCR NEGATIVE NEGATIVE Final    Comment:        The GeneXpert MRSA Assay (FDA approved for NASAL specimens only), is one component of a comprehensive MRSA colonization surveillance program. It is not intended to diagnose MRSA infection nor to guide or monitor treatment for MRSA infections. Performed at [facility], [place] [address], [place], [place] [address]   Culture, respiratory     Status: None   Collection Time: 07/15/20  2:38 PM  Specimen: Bronchoalveolar Lavage; Respiratory  Result Value Ref Range Status   Specimen Description BRONCHIAL ALVEOLAR LAVAGE  Final   Special Requests NONE  Final   Gram Stain   Final    MODERATE WBC PRESENT,BOTH PMN AND MONONUCLEAR NO ORGANISMS SEEN    Culture   Final    NO GROWTH 2 DAYS Performed at [facility], [address], [place], [place] [address]    Report Status 07/17/2020 FINAL  Final  Culture, respiratory (non-expectorated)     Status: None   Collection Time: 07/19/20 10:06 AM   Specimen: Tracheal Aspirate; Respiratory  Result Value Ref Range Status   Specimen Description TRACHEAL ASPIRATE  Final   Special Requests NONE  Final   Gram Stain   Final    NO WBC SEEN NO ORGANISMS SEEN Performed at [facility], [address], [place], [place] [address]    Culture FEW ACINETOBACTER CALCOACETICUS/BAUMANNII COMPLEX  Final   Report Status 07/21/2020 FINAL  Final   Organism ID, Bacteria ACINETOBACTER CALCOACETICUS/BAUMANNII COMPLEX  Final      Susceptibility   Acinetobacter calcoaceticus/baumannii  complex - MIC*    CEFTAZIDIME 4 SENSITIVE Sensitive     CIPROFLOXACIN <=0.25 SENSITIVE Sensitive     GENTAMICIN <=1 SENSITIVE Sensitive     IMIPENEM <=0.25 SENSITIVE Sensitive     PIP/TAZO <=4 SENSITIVE Sensitive     TRIMETH/SULFA 160 RESISTANT Resistant     AMPICILLIN/SULBACTAM <=2 SENSITIVE Sensitive     * FEW ACINETOBACTER CALCOACETICUS/BAUMANNII COMPLEX  Culture, blood (routine x 2)     Status: None (Preliminary result)   Collection Time: 07/19/20  6:30 PM   Specimen: BLOOD LEFT ARM  Result Value Ref Range Status   Specimen Description BLOOD LEFT ARM  Final   Special Requests   Final    AEROBIC BOTTLE ONLY Blood Culture results may not be optimal due to an inadequate volume of blood received in culture bottles   Culture   Final    NO GROWTH 4 DAYS Performed at [facility], [place] [address] [place], [place] [address]    Report Status PENDING  Incomplete  Culture, blood (routine x 2)     Status: None (Preliminary result)   Collection Time: 07/19/20  6:35 PM   Specimen: BLOOD LEFT HAND  Result Value Ref Range Status   Specimen Description BLOOD LEFT HAND  Final   Special Requests   Final    BOTTLES DRAWN AEROBIC AND ANAEROBIC Blood Culture adequate volume   Culture   Final    NO GROWTH 4 DAYS Performed at [facility], [place] [address] [place], [place] [address]    Report Status PENDING  Incomplete         Radiology Studies: No results found.      Scheduled Meds: . chlorhexidine gluconate (MEDLINE KIT)  15 mL Mouth Rinse BID  . Chlorhexidine Gluconate Cloth  6 each Topical Daily  . glycopyrrolate  0.2 mg Intravenous QID  . mouth rinse  15 mL Mouth Rinse 10 times per day  .  morphine injection  2 mg Intravenous Q4H   Continuous Infusions: . sodium chloride Stopped (07/21/20 1616)  . levETIRAcetam 1,000 mg (07/23/20 1355)     LOS: 11 days    Phillips Climes, MD [facility]  If 7PM-7AM, please contact night-coverage  07/23/2020, 3:12 PM

## 2020-07-24 ENCOUNTER — Other Ambulatory Visit: Payer: Self-pay

## 2020-07-24 LAB — CULTURE, BLOOD (ROUTINE X 2)
Culture: NO GROWTH
Culture: NO GROWTH
Special Requests: ADEQUATE

## 2020-07-24 NOTE — Plan of Care (Signed)
  Problem: Education: Goal: Knowledge of General Education information will improve Description: Including pain rating scale, medication(s)/side effects and non-pharmacologic comfort measures Outcome: Not Applicable   Problem: Health Behavior/Discharge Planning: Goal: Ability to manage health-related needs will improve Outcome: Not Applicable   Problem: Clinical Measurements: Goal: Ability to maintain clinical measurements within normal limits will improve Outcome: Not Applicable Goal: Will remain free from infection Outcome: Not Applicable Goal: Diagnostic test results will improve Outcome: Not Applicable Goal: Respiratory complications will improve Outcome: Not Applicable Goal: Cardiovascular complication will be avoided Outcome: Not Applicable   Problem: Activity: Goal: Risk for activity intolerance will decrease Outcome: Not Applicable   Problem: Nutrition: Goal: Adequate nutrition will be maintained Outcome: Not Applicable   Problem: Coping: Goal: Level of anxiety will decrease Outcome: Not Applicable   Problem: Elimination: Goal: Will not experience complications related to bowel motility Outcome: Not Applicable Goal: Will not experience complications related to urinary retention Outcome: Not Applicable   Problem: Pain Managment: Goal: General experience of comfort will improve Outcome: Not Applicable   Problem: Safety: Goal: Ability to remain free from injury will improve Outcome: Not Applicable   Problem: Skin Integrity: Goal: Risk for impaired skin integrity will decrease Outcome: Not Applicable   Problem: Education: Goal: Knowledge of the prescribed therapeutic regimen Outcome: Not Applicable Goal: Knowledge of disease or condition will improve Outcome: Not Applicable   Problem: Clinical Measurements: Goal: Neurologic status will improve Outcome: Not Applicable   Problem: Tissue Perfusion: Goal: Ability to maintain intracranial pressure will  improve Outcome: Not Applicable   Problem: Respiratory: Goal: Will regain and/or maintain adequate ventilation Outcome: Not Applicable   Problem: Skin Integrity: Goal: Risk for impaired skin integrity will decrease Outcome: Not Applicable Goal: Demonstration of wound healing without infection will improve Outcome: Not Applicable   Problem: Psychosocial: Goal: Ability to verbalize positive feelings about self will improve Outcome: Not Applicable Goal: Ability to participate in self-care as condition permits will improve Outcome: Not Applicable Goal: Ability to identify appropriate support needs will improve Outcome: Not Applicable   Problem: Health Behavior/Discharge Planning: Goal: Ability to manage health-related needs will improve Outcome: Not Applicable   Problem: Nutritional: Goal: Risk of aspiration will decrease Outcome: Not Applicable Goal: Dietary intake will improve Outcome: Not Applicable   Problem: Communication: Goal: Ability to communicate needs accurately will improve Outcome: Not Applicable   Problem: Activity: Goal: Ability to tolerate increased activity will improve Outcome: Not Applicable   Problem: Respiratory: Goal: Ability to maintain a clear airway and adequate ventilation will improve Outcome: Not Applicable   Problem: Role Relationship: Goal: Method of communication will improve Outcome: Not Applicable

## 2020-07-24 NOTE — Progress Notes (Signed)
PROGRESS NOTE    DACI STUBBE  OZH:[id number] DOB: 1976/07/22 DOA: 07/14/2020 PCP: Patient, No Pcp Per   Brief Narrative:  44 year old with history of MS, schizoaffective disorder, depression brought to the hospital after being found unresponsive after a fall at home 2 days prior to admission.  Found to have scattered subarachnoid hemorrhage.  Patient was intubated for airway protection and taken to the ICU.  CT of the head showed acute scattered SAH, subdural hygroma.  CT spine-negative for acute fractures.  CT chest abdomen pelvis-hazy opacity with displaced rib fractures.  MRI brain showed extensive traumatic cortical contusions/infarct.  EEG was slightly abnormal showing sharp waves but no seizure activity.  Tested positive for COVID-19 on 8/28, respiratory cultures-positive for H. influenzae.  Completed course of cefepime which was transitioned to Rocephin.  After discussion with palliative care team, patient was transitioned to comfort care.   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 -Continue prophylaxis Keppra but patient has been transitioned to comfort care now.  Acute hypoxic respiratory failure, H. 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  On trach collar -Routine trach care.  Discontinue antibiotics.  Patient is comfort care.  Asymptomatic COVID-19 infection -Supportive care, as needed bronchodilators  Schizoaffective disorder and depression -Currently home meds are on hold.  Dysphagia -Core track removed.  Patient is comfort  care.  Sympathetic storm -Discontinue home routine medications  Goals of care discussion -Seen by palliative care service.  Patient has been transitioned to full comfort measure, comfort/palliative measures managed per palliative medicine.   DVT prophylaxis: comfort care Code Status: comfort  Care  Family Communication: D/W father by phone  Status is: Inpatient  Remains inpatient appropriate because:Inpatient level of care appropriate due to severity of illness   Dispo: The patient is from: Home              Anticipated d/c is to: Hospital death              Anticipated d/c date is: 2 days              Patient currently is not medically stable to d/c.  Patient will likely have a hospital death. Body mass index is 19.48 kg/m.  Subjective: Patient is laying in the bed unresponsive.  No meaningful interaction.  No response to verbal or physical stimuli   Examination:  Patient sleeping comfortably, unresponsive  tracheostomy in place  diminished air entry bilaterally Regular rate and rhythm Unable to assess neurologic or psychiatric status given she is unresponsive     Objective: Vitals:   07/23/20 2349 07/24/20 0029 07/24/20 0451 07/24/20 0800  BP:  (!) 142/84  116/90  Pulse:  (!) 139 (!) 133 (!) 130  Resp: 20 (!) 22 (!) 26 (!) 24  Temp:  (!) 102.1 F (38.9 C)  99.2 F (37.3 C)  TempSrc:  Axillary  Axillary  SpO2: 97% 98% 97% 97%  Weight:      Height:        Intake/Output Summary (Last 24 hours) at 07/24/2020 1422 Last data filed at 07/24/2020 0400 Gross per 24 hour  Intake 400  ml  Output 750 ml  Net -350 ml   Filed Weights   07/19/20 0400 07/20/20 0430 07/21/20 0330  Weight: 56.2 kg 58.8 kg 58.1 kg     Data Reviewed:   CBC: Recent Labs  Lab 07/18/20 0829 07/19/20 1812  WBC 18.8* 19.5*  HGB 12.2 11.8*  HCT 37.3 36.1  MCV 100.5* 102.6*  PLT 391 010   Basic Metabolic Panel: Recent Labs  Lab 07/18/20 0829 07/19/20 0658  NA 139 144  K 3.7 4.2   CL 106 110  CO2 21* 22  GLUCOSE 121* 123*  BUN 13 11  CREATININE 0.52 0.54  CALCIUM 8.9 9.6  MG  --  2.2  PHOS  --  3.0   GFR: Estimated Creatinine Clearance: 83.2 mL/min (by C-G formula based on SCr of 0.54 mg/dL). Liver Function Tests: No results for input(s): AST, ALT, ALKPHOS, BILITOT, PROT, ALBUMIN in the last 168 hours. No results for input(s): LIPASE, AMYLASE in the last 168 hours. No results for input(s): AMMONIA in the last 168 hours. Coagulation Profile: No results for input(s): INR, PROTIME in the last 168 hours. Cardiac Enzymes: No results for input(s): CKTOTAL, CKMB, CKMBINDEX, TROPONINI in the last 168 hours. BNP (last 3 results) No results for input(s): PROBNP in the last 8760 hours. HbA1C: No results for input(s): HGBA1C in the last 72 hours. CBG: Recent Labs  Lab 07/20/20 2353 07/21/20 0305 07/21/20 0749 07/21/20 0820 07/21/20 1226  GLUCAP 132* 127* 119* 117* 95   Lipid Profile: No results for input(s): CHOL, HDL, LDLCALC, TRIG, CHOLHDL, LDLDIRECT in the last 72 hours. Thyroid Function Tests: No results for input(s): TSH, T4TOTAL, FREET4, T3FREE, THYROIDAB in the last 72 hours. Anemia Panel: No results for input(s): VITAMINB12, FOLATE, FERRITIN, TIBC, IRON, RETICCTPCT in the last 72 hours. Sepsis Labs: No results for input(s): PROCALCITON, LATICACIDVEN in the last 168 hours.  Recent Results (from the past 240 hour(s))  Culture, respiratory     Status: None   Collection Time: 07/15/20  2:38 PM   Specimen: Bronchoalveolar Lavage; Respiratory  Result Value Ref Range Status   Specimen Description BRONCHIAL ALVEOLAR LAVAGE  Final   Special Requests NONE  Final   Gram Stain   Final    MODERATE WBC PRESENT,BOTH PMN AND MONONUCLEAR NO ORGANISMS SEEN    Culture   Final    NO GROWTH 2 DAYS Performed at [facility], [address] [place], [place] [address]    Report Status 07/17/2020 FINAL  Final  Culture, respiratory (non-expectorated)      Status: None   Collection Time: 07/19/20 10:06 AM   Specimen: Tracheal Aspirate; Respiratory  Result Value Ref Range Status   Specimen Description TRACHEAL ASPIRATE  Final   Special Requests NONE  Final   Gram Stain   Final    NO WBC SEEN NO ORGANISMS SEEN Performed at [facility], [address] [place], [place] [address]    Culture FEW ACINETOBACTER CALCOACETICUS/BAUMANNII COMPLEX  Final   Report Status 07/21/2020 FINAL  Final   Organism ID, Bacteria ACINETOBACTER CALCOACETICUS/BAUMANNII COMPLEX  Final      Susceptibility   Acinetobacter calcoaceticus/baumannii complex - MIC*    CEFTAZIDIME 4 SENSITIVE Sensitive     CIPROFLOXACIN <=0.25 SENSITIVE Sensitive     GENTAMICIN <=1 SENSITIVE Sensitive     IMIPENEM <=0.25 SENSITIVE Sensitive     PIP/TAZO <=4 SENSITIVE Sensitive     TRIMETH/SULFA 160 RESISTANT Resistant     AMPICILLIN/SULBACTAM <=2 SENSITIVE Sensitive     *  FEW ACINETOBACTER CALCOACETICUS/BAUMANNII COMPLEX  Culture, blood (routine x 2)     Status: None   Collection Time: 07/19/20  6:30 PM   Specimen: BLOOD LEFT ARM  Result Value Ref Range Status   Specimen Description BLOOD LEFT ARM  Final   Special Requests   Final    AEROBIC BOTTLE ONLY Blood Culture results may not be optimal due to an inadequate volume of blood received in culture bottles   Culture   Final    NO GROWTH 5 DAYS Performed at [facility], [place] [address], [place], [place] [address]    Report Status 07/24/2020 FINAL  Final  Culture, blood (routine x 2)     Status: None   Collection Time: 07/19/20  6:35 PM   Specimen: BLOOD LEFT HAND  Result Value Ref Range Status   Specimen Description BLOOD LEFT HAND  Final   Special Requests   Final    BOTTLES DRAWN AEROBIC AND ANAEROBIC Blood Culture adequate volume   Culture   Final    NO GROWTH 5 DAYS Performed at [facility], [place] [address], [place], [place] [address]    Report Status 07/24/2020 FINAL  Final         Radiology  Studies: No results found.      Scheduled Meds: . chlorhexidine gluconate (MEDLINE KIT)  15 mL Mouth Rinse BID  . Chlorhexidine Gluconate Cloth  6 each Topical Daily  . glycopyrrolate  0.2 mg Intravenous QID  . mouth rinse  15 mL Mouth Rinse 10 times per day  .  morphine injection  2 mg Intravenous Q4H   Continuous Infusions: . sodium chloride Stopped (07/21/20 1616)  . levETIRAcetam 1,000 mg (07/24/20 1342)     LOS: 12 days    Phillips Climes, MD [facility]  If 7PM-7AM, please contact night-coverage  07/24/2020, 2:22 PM
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
## 2020-08-10 MED FILL — Medication: Qty: 1 | Status: AC
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
## 2020-08-15 NOTE — Progress Notes (Signed)
Patient ID: MAGDALENE TARDIFF, female   DOB: [birth year], 44 y.o.   MRN: [id number]  This NP spoke with treatment team and bedside RN.   Focus of care is full comfort.  According to nursing patient appears comfortable and is unresponsive and unable to follow commands.   Medical records reviewed.   No exam 2/2 to decreasing exposure to covid-19.   Spoke with  father/ Westly Pam by telephone to update on his daughter's condition.    He appreciates daily updates  Family understand the limited prognosis.  They are at peace with their decision for a full comfort path focusing on comfort and dignity.  Education offered on the natural trajectory and expectations and end-of-life.    Prognosis is likely hours to  days.  Questions and concerns addressed   Discussed with attending   Total time spent on the unit was 15 minutes    PMT will continue to support holistically.  Greater than 50% of the time was spent in counseling and coordination of care  Lorinda Creed NP  Palliative Medicine Team Team Phone # [phone] Pager [phone]
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
## 2020-08-15 NOTE — Progress Notes (Addendum)
TOD 2028.  No audible heartbeat on auscultation, confirmed by 2nd nurse.  No visible sign of respiration, no rise or fall of chest, no pulses found.    MD Shalhoub notified and he notified NOK.  AC notified.    [facility] notified [phone] Dutch Quint.  Pt excluded from all donation.    Medical Examiner notified [phone].  Detective Laural Benes [phone] notified.

## 2020-08-15 DEATH — deceased

## 2020-10-16 IMAGING — MR MR HEAD W/O CM
9 of 10 series · 37 of 48 positions shown · non-contrast
Comparison: Multiple priors, including CT head 07/13/2020 and MRI
head 06/23/2017

CLINICAL DATA: Traumatic brain injury with persistent deficits.
Unresponsive.

EXAM:
MRI HEAD WITHOUT CONTRAST
TECHNIQUE: Multiplanar, multiecho pulse sequences of the brain and surrounding
structures were obtained without intravenous contrast.

[Series 3: DWI · axial · 3.0mm · 1.09mm/px · z∈[-84,+69]mm · 11 of 104 slices shown (1 of 4)]
[im 1/104]
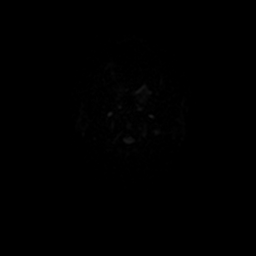
[im 11/104]
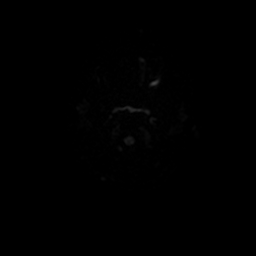
[im 21/104]
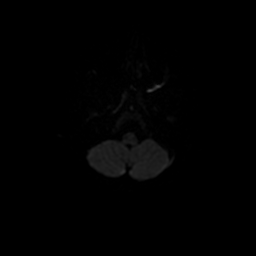
[im 31/104]
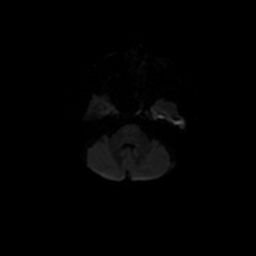
[im 42/104]
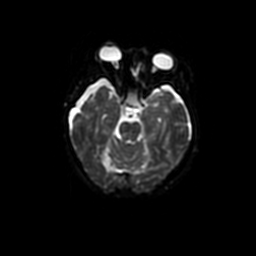
[im 52/104]
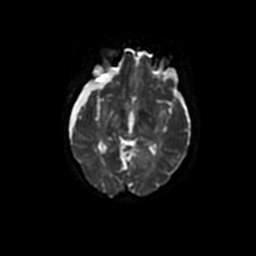
[im 62/104]
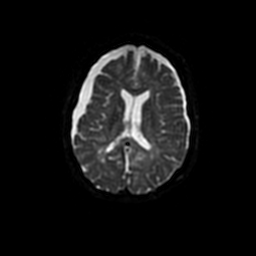
[im 73/104]
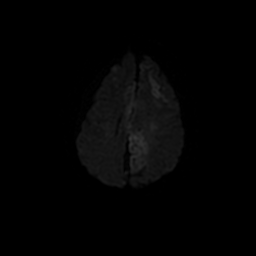
[im 83/104]
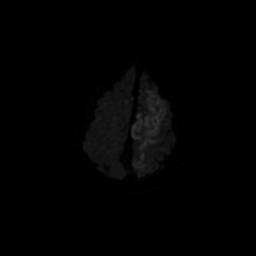
[im 93/104]
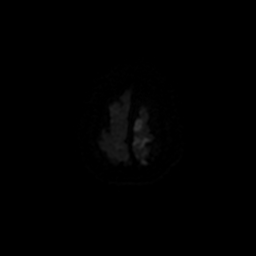
[im 104/104]
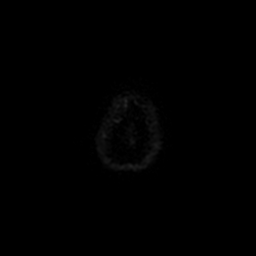

[Series 4: DWI · coronal · 5.0mm · 1.09mm/px · 8 of 74 slices shown (2 of 4)]
[im 1/74]
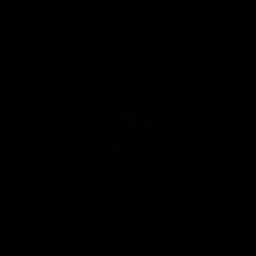
[im 11/74]
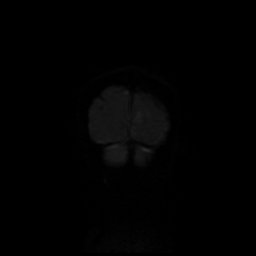
[im 21/74]
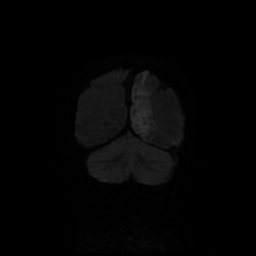
[im 32/74]
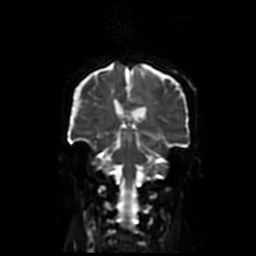
[im 42/74]
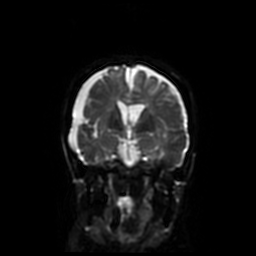
[im 53/74]
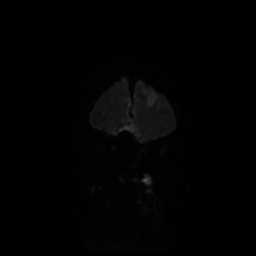
[im 63/74]
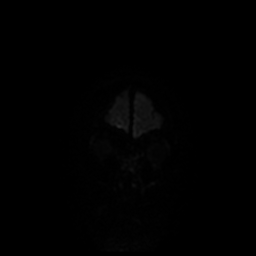
[im 74/74]
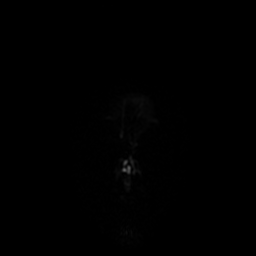

[Series 6: T1 · sagittal · 5.0mm · 0.47mm/px · 2 of 22 slices shown (1 of 2)]
[im 1/22]
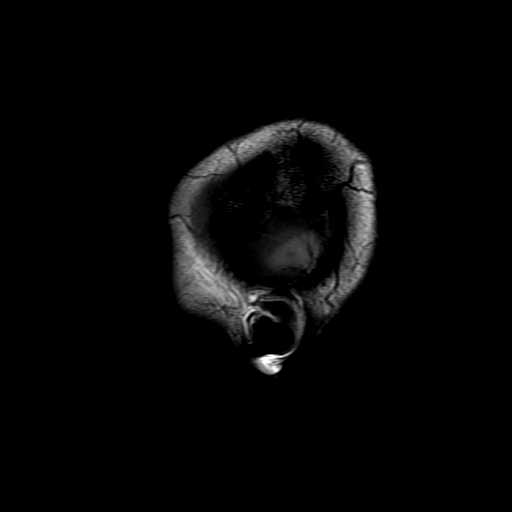
[im 22/22]
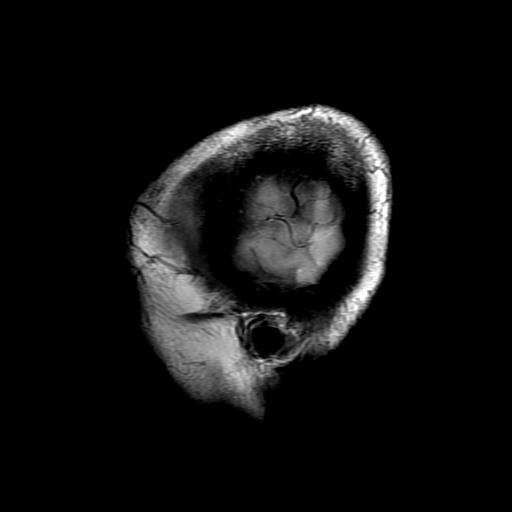

[Series 7: FLAIR · axial · 3.0mm · 0.43mm/px · z∈[-79,+65]mm · 2 of 25 slices shown]
[im 1/25]
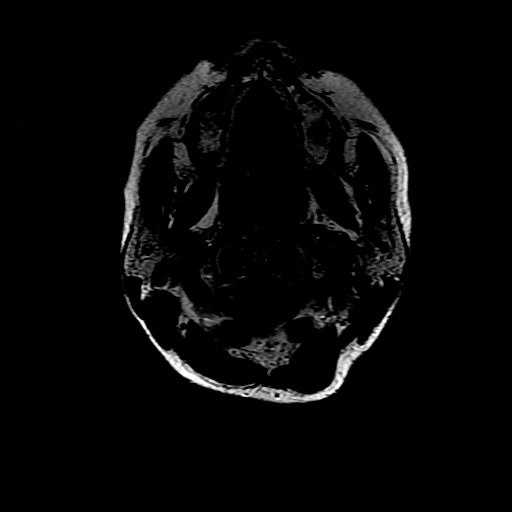
[im 25/25]
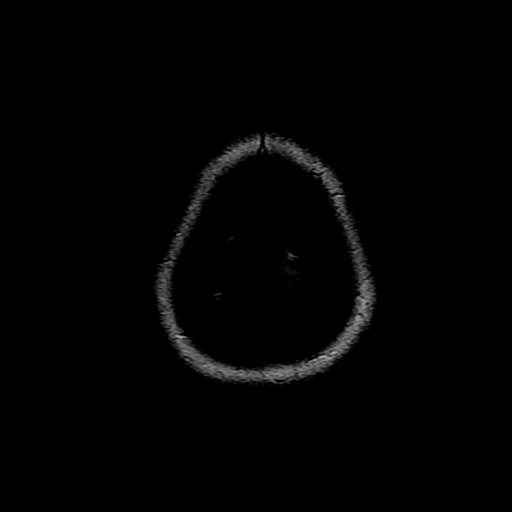

[Series 8: T2 · axial · 5.0mm · 0.43mm/px · z∈[-79,+65]mm · 2 of 25 slices shown (1 of 2)]
[im 1/25]
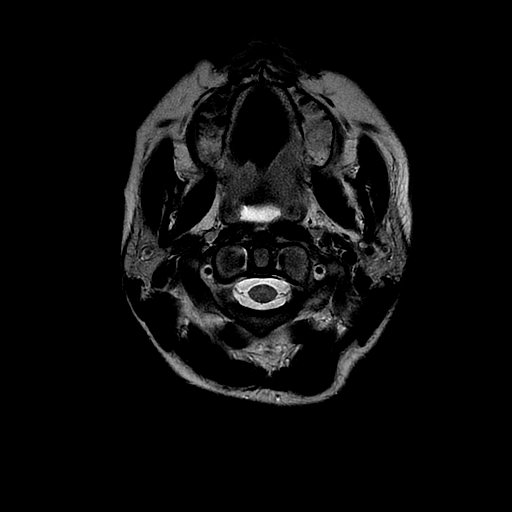
[im 25/25]
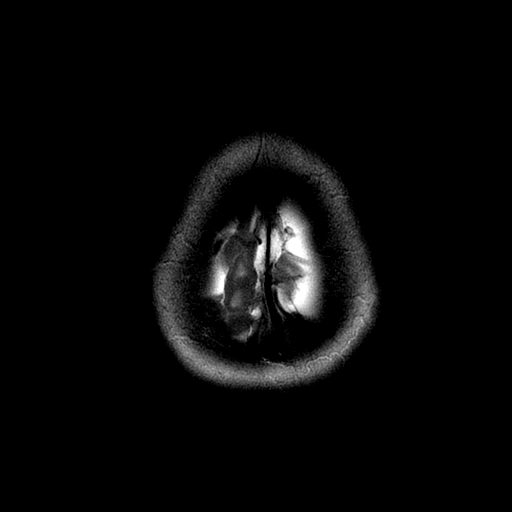

[Series 9: T1 · axial · 3.0mm · 0.43mm/px · z∈[-80,-63]mm · 2 of 100 slices shown (2 of 2)]
[im 1/100]
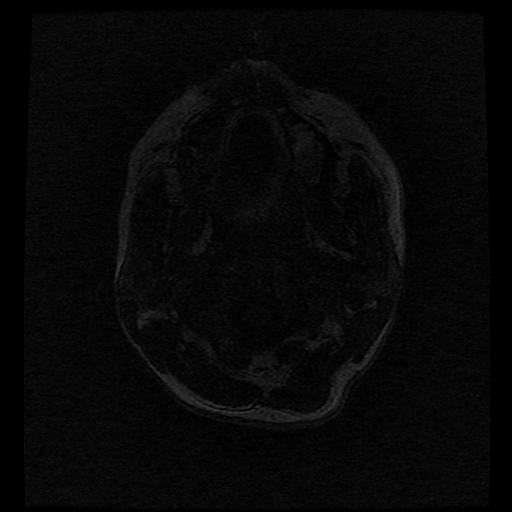
[im 12/100]
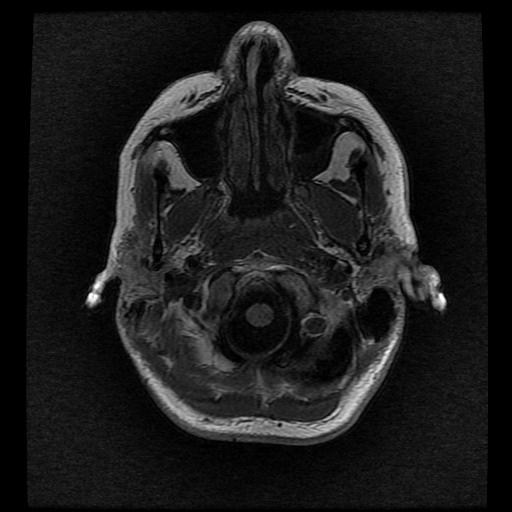

[Series 10: T2 · coronal · 5.0mm · 0.39mm/px · 2 of 25 slices shown (2 of 2)]
[im 1/25]
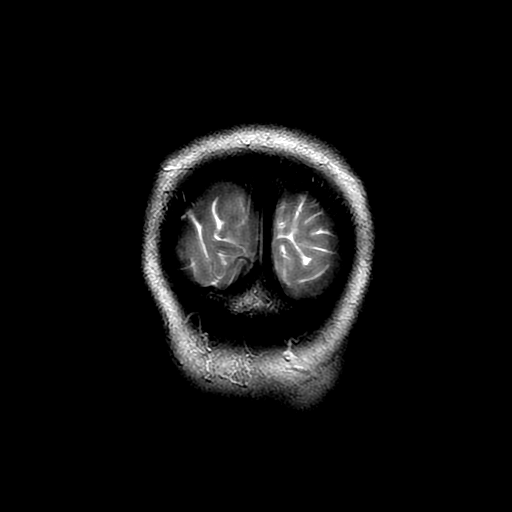
[im 25/25]
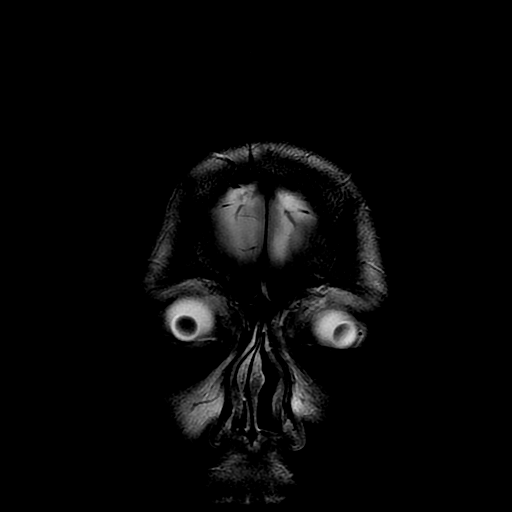

[Series 300: DWI · axial · 3.0mm · 1.09mm/px · z∈[-84,+69]mm · 5 of 52 slices shown (3 of 4)]
[im 1/52]
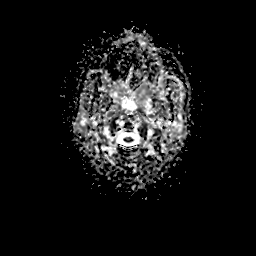
[im 13/52]
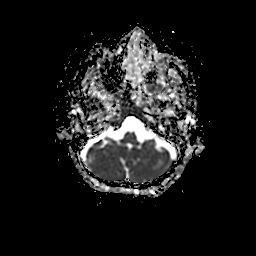
[im 26/52]
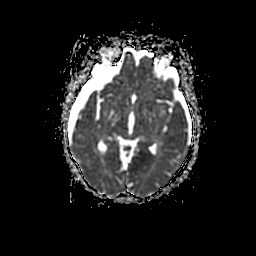
[im 39/52]
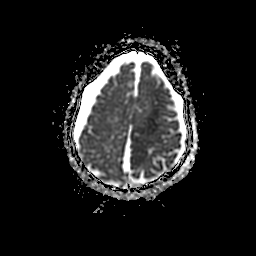
[im 52/52]
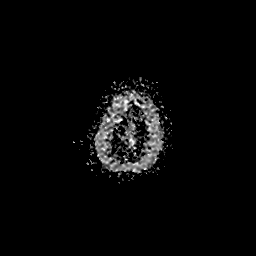

[Series 400: DWI · coronal · 5.0mm · 1.09mm/px · 3 of 34 slices shown (4 of 4)]
[im 1/34]
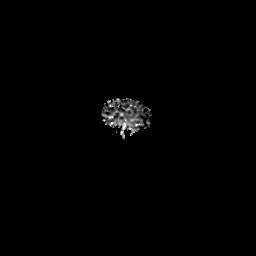
[im 17/34]
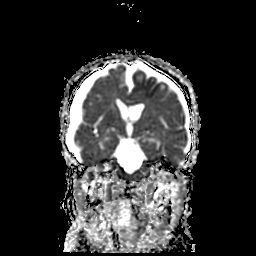
[im 34/34]
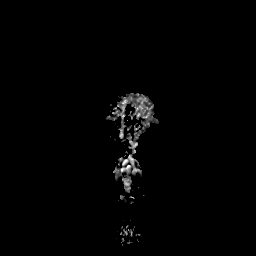

[37 of 48 positions shown; findings below may reference images not displayed]

FINDINGS: Brain: Extensive abnormal cortical restricted diffusion involving
the parasagittal left greater than right frontal lobes, the high
left parietal lobe, and the anteromedial left temporal lobe and
hippocampus. Additional foci of restricted diffusion involving the
right midbrain. There is mild associated cortical edema with local
sulcal effacement. No midline shift or herniation. Bilateral right
greater than left subdural collections, measuring up to 12 mm on the
right and 8 mm on the left, similar when comparing across
modalities. Small amount of T1 hyperintensity and susceptibility
artifact subdural hemorrhage layering along the posterior aspect of
the falx, bilateral tentorial leaflets, and overlying the left
greater than right posterior convexities and posterior fossa,
measuring up to 3 mm and similar in volume when comparing across
modalities. Trace T1 hyperintense subarachnoid hemorrhage layering
within cerebellar folia and sulci. Similar approximately 2 mm of
rightward midline shift at the foramina [place]. Extensive, age
advanced white matter signal abnormality with brain atrophy, grossly
similar to prior MRI from February 21, 2017.

Vascular: Flow voids are grossly maintained proximally.

Skull and upper cervical spine: Negative.

Sinuses/Orbits: Mucosal thickening of bilateral sphenoid sinuses and
left posterior ethmoid air cells.

Other: See recent CT face for characterization nasal bone fracture.
IMPRESSION: 1. Extensive restricted diffusion involving the parasagittal left
greater than right frontal lobes, high left parietal lobe,
anteromedial left temporal lobe/hippocampus, and possibly the right
midbrain. Findings are most consistent with traumatic cortical
contusions/infarcts given the patient's clinical history and prior
imaging. Seizure activity could be contributory. Mild associated
edema and local sulcal effacement.
2. Similar right greater than left subdural collections, 12 mm on
the right and 8 mm on the left. Otherwise, similar versus slightly
decreased multifocal subdural and subarachnoid hemorrhage, as
detailed above.
3. Extensive, age advanced white matter signal abnormality with
brain atrophy, grossly similar to prior MRI from February 21, 2017 and
possibly related to chronic demyelination. Post contrast imaging
could be obtained if there is concern for active demyelination.
